# Patient Record
Sex: Female | Born: 1935 | Race: White | Hispanic: No | Marital: Married | Smoking: Never smoker
Health system: Southern US, Community
[De-identification: ages and names within clinical notes are randomized; demographics above are authoritative.]

## PROBLEM LIST (undated history)

## (undated) DIAGNOSIS — E785 Hyperlipidemia, unspecified: Secondary | ICD-10-CM

## (undated) DIAGNOSIS — I10 Essential (primary) hypertension: Secondary | ICD-10-CM

## (undated) DIAGNOSIS — I499 Cardiac arrhythmia, unspecified: Secondary | ICD-10-CM

## (undated) DIAGNOSIS — R002 Palpitations: Secondary | ICD-10-CM

## (undated) DIAGNOSIS — Z8719 Personal history of other diseases of the digestive system: Secondary | ICD-10-CM

## (undated) DIAGNOSIS — R112 Nausea with vomiting, unspecified: Secondary | ICD-10-CM

## (undated) DIAGNOSIS — F410 Panic disorder [episodic paroxysmal anxiety] without agoraphobia: Secondary | ICD-10-CM

## (undated) DIAGNOSIS — T8859XA Other complications of anesthesia, initial encounter: Secondary | ICD-10-CM

## (undated) DIAGNOSIS — Z9889 Other specified postprocedural states: Secondary | ICD-10-CM

## (undated) DIAGNOSIS — C9110 Chronic lymphocytic leukemia of B-cell type not having achieved remission: Secondary | ICD-10-CM

## (undated) DIAGNOSIS — R519 Headache, unspecified: Secondary | ICD-10-CM

## (undated) DIAGNOSIS — K219 Gastro-esophageal reflux disease without esophagitis: Secondary | ICD-10-CM

## (undated) DIAGNOSIS — I209 Angina pectoris, unspecified: Secondary | ICD-10-CM

## (undated) DIAGNOSIS — M1990 Unspecified osteoarthritis, unspecified site: Secondary | ICD-10-CM

## (undated) HISTORY — PX: TONSILLECTOMY: SUR1361

## (undated) HISTORY — PX: BREAST LUMPECTOMY: SHX2

## (undated) HISTORY — PX: EYE SURGERY: SHX253

## (undated) HISTORY — DX: Palpitations: R00.2

---

## 1992-07-09 HISTORY — PX: HEMORRHOID SURGERY: SHX153

## 2011-07-10 DIAGNOSIS — I639 Cerebral infarction, unspecified: Secondary | ICD-10-CM

## 2011-07-10 HISTORY — DX: Cerebral infarction, unspecified: I63.9

## 2011-10-12 ENCOUNTER — Emergency Department (HOSPITAL_COMMUNITY): Payer: Medicare Other

## 2011-10-12 ENCOUNTER — Encounter (HOSPITAL_COMMUNITY): Payer: Self-pay | Admitting: *Deleted

## 2011-10-12 ENCOUNTER — Other Ambulatory Visit: Payer: Self-pay

## 2011-10-12 ENCOUNTER — Inpatient Hospital Stay (HOSPITAL_COMMUNITY): Payer: Medicare Other

## 2011-10-12 ENCOUNTER — Inpatient Hospital Stay (HOSPITAL_COMMUNITY)
Admission: EM | Admit: 2011-10-12 | Discharge: 2011-10-15 | DRG: 065 | Disposition: A | Discharge status: Home / Self Care | Payer: Medicare Other | Attending: Family Medicine | Admitting: Family Medicine

## 2011-10-12 DIAGNOSIS — E785 Hyperlipidemia, unspecified: Secondary | ICD-10-CM

## 2011-10-12 DIAGNOSIS — I480 Paroxysmal atrial fibrillation: Secondary | ICD-10-CM

## 2011-10-12 DIAGNOSIS — I639 Cerebral infarction, unspecified: Secondary | ICD-10-CM

## 2011-10-12 DIAGNOSIS — I4891 Unspecified atrial fibrillation: Secondary | ICD-10-CM | POA: Diagnosis present

## 2011-10-12 DIAGNOSIS — E782 Mixed hyperlipidemia: Secondary | ICD-10-CM | POA: Insufficient documentation

## 2011-10-12 DIAGNOSIS — I10 Essential (primary) hypertension: Secondary | ICD-10-CM

## 2011-10-12 DIAGNOSIS — C9110 Chronic lymphocytic leukemia of B-cell type not having achieved remission: Secondary | ICD-10-CM

## 2011-10-12 DIAGNOSIS — Z66 Do not resuscitate: Secondary | ICD-10-CM | POA: Diagnosis present

## 2011-10-12 DIAGNOSIS — I6350 Cerebral infarction due to unspecified occlusion or stenosis of unspecified cerebral artery: Principal | ICD-10-CM | POA: Diagnosis present

## 2011-10-12 HISTORY — DX: Essential (primary) hypertension: I10

## 2011-10-12 HISTORY — DX: Panic disorder (episodic paroxysmal anxiety): F41.0

## 2011-10-12 HISTORY — DX: Chronic lymphocytic leukemia of B-cell type not having achieved remission: C91.10

## 2011-10-12 HISTORY — DX: Hyperlipidemia, unspecified: E78.5

## 2011-10-12 LAB — URINALYSIS, ROUTINE W REFLEX MICROSCOPIC
Bilirubin Urine: NEGATIVE
Glucose, UA: NEGATIVE mg/dL
Hgb urine dipstick: NEGATIVE
Specific Gravity, Urine: 1.026 (ref 1.005–1.030)
pH: 7.5 (ref 5.0–8.0)

## 2011-10-12 LAB — DIFFERENTIAL
Basophils Absolute: 0 10*3/uL (ref 0.0–0.1)
Lymphocytes Relative: 75 % — ABNORMAL HIGH (ref 12–46)
Monocytes Relative: 5 % (ref 3–12)
Neutro Abs: 3.6 10*3/uL (ref 1.7–7.7)

## 2011-10-12 LAB — POCT I-STAT, CHEM 8
Creatinine, Ser: 0.8 mg/dL (ref 0.50–1.10)
Glucose, Bld: 99 mg/dL (ref 70–99)
Hemoglobin: 15.6 g/dL — ABNORMAL HIGH (ref 12.0–15.0)
Sodium: 144 mEq/L (ref 135–145)
TCO2: 27 mmol/L (ref 0–100)

## 2011-10-12 LAB — CBC
HCT: 44.1 % (ref 36.0–46.0)
Platelets: 215 10*3/uL (ref 150–400)
RDW: 13.3 % (ref 11.5–15.5)
WBC: 18.7 10*3/uL — ABNORMAL HIGH (ref 4.0–10.5)

## 2011-10-12 LAB — COMPREHENSIVE METABOLIC PANEL
ALT: 22 U/L (ref 0–35)
AST: 27 U/L (ref 0–37)
Albumin: 3.9 g/dL (ref 3.5–5.2)
Alkaline Phosphatase: 77 U/L (ref 39–117)
Chloride: 105 mEq/L (ref 96–112)
Potassium: 4.5 mEq/L (ref 3.5–5.1)
Total Bilirubin: 0.5 mg/dL (ref 0.3–1.2)

## 2011-10-12 LAB — GLUCOSE, CAPILLARY: Glucose-Capillary: 108 mg/dL — ABNORMAL HIGH (ref 70–99)

## 2011-10-12 LAB — HEMOGLOBIN A1C: Hgb A1c MFr Bld: 5.4 % (ref <5.7)

## 2011-10-12 LAB — APTT: aPTT: 30 seconds (ref 24–37)

## 2011-10-12 MED ORDER — LORAZEPAM 2 MG/ML IJ SOLN
1.0000 mg | Freq: Once | INTRAMUSCULAR | Status: AC
  Administered 2011-10-12: 1 mg via INTRAVENOUS

## 2011-10-12 MED ORDER — HEPARIN SODIUM (PORCINE) 5000 UNIT/ML IJ SOLN
5000.0000 [IU] | Freq: Three times a day (TID) | INTRAMUSCULAR | Status: DC
  Administered 2011-10-12 – 2011-10-15 (×8): 5000 [IU] via SUBCUTANEOUS
  Filled 2011-10-12 (×12): qty 1

## 2011-10-12 MED ORDER — SODIUM CHLORIDE 0.9 % IV SOLN
INTRAVENOUS | Status: AC
  Administered 2011-10-12: 16:00:00 via INTRAVENOUS

## 2011-10-12 MED ORDER — VITAMIN D3 25 MCG (1000 UNIT) PO TABS
1000.0000 [IU] | ORAL_TABLET | Freq: Every day | ORAL | Status: DC
  Administered 2011-10-13 – 2011-10-15 (×3): 1000 [IU] via ORAL
  Filled 2011-10-12 (×4): qty 1

## 2011-10-12 MED ORDER — ATORVASTATIN CALCIUM 10 MG PO TABS
10.0000 mg | ORAL_TABLET | Freq: Every evening | ORAL | Status: DC
  Administered 2011-10-12: 10 mg via ORAL
  Filled 2011-10-12 (×2): qty 1

## 2011-10-12 MED ORDER — ADULT MULTIVITAMIN W/MINERALS CH
1.0000 | ORAL_TABLET | Freq: Every day | ORAL | Status: DC
  Filled 2011-10-12 (×4): qty 1

## 2011-10-12 MED ORDER — GADOBENATE DIMEGLUMINE 529 MG/ML IV SOLN
10.0000 mL | Freq: Once | INTRAVENOUS | Status: AC
  Administered 2011-10-12: 10 mL via INTRAVENOUS

## 2011-10-12 MED ORDER — METOPROLOL TARTRATE 1 MG/ML IV SOLN
2.5000 mg | Freq: Four times a day (QID) | INTRAVENOUS | Status: DC
  Administered 2011-10-12 – 2011-10-13 (×2): 2.5 mg via INTRAVENOUS
  Filled 2011-10-12 (×8): qty 5

## 2011-10-12 MED ORDER — OMEGA-3-ACID ETHYL ESTERS 1 G PO CAPS
1.0000 g | ORAL_CAPSULE | Freq: Two times a day (BID) | ORAL | Status: DC
  Filled 2011-10-12 (×7): qty 1

## 2011-10-12 MED ORDER — THERA M PLUS PO TABS: 1.0000 | ORAL_TABLET | Freq: Every day | ORAL | Status: DC

## 2011-10-12 MED ORDER — OMEGA-3 FATTY ACIDS 1000 MG PO CAPS: 1.0000 g | ORAL_CAPSULE | Freq: Two times a day (BID) | ORAL | Status: DC

## 2011-10-12 NOTE — H&P (Signed)
PCP:   Pamelia Hoit, MD, MD   Chief Complaint:  Stroke  Was totally normal until 14:00pm-was doing aperwoerk.  Went to the calender to see what was going on and was terying to relay this to her husband and then had some sluured speech.  THis has never happened before.  NO loc, no seizures , no abnormal movement sof the body, no CP, No headache  no pains-he didn't notice anything.  She did denote some thckness of tongue. she had a good's night rest overnight, and told by her family doctor that she needed to see him and was directed to the ED.  Review of Systems:  The patient denies  fever, weight loss + with the CLL, vision loss, decreased hearing, hoarseness, chest pain, syncope, dyspnea on exertion, peripheral edema, balance deficits, hemoptysis, abdominal pain, melena, hematochezia, severe indigestion/heartburn, hematuria, incontinence, genital sores, muscle weakness, suspicious skin lesions, transient blindness, difficulty walking, depression, unusual weight change, abnormal bleeding, enlarged lymph nodes, angioedema, and breast masses.  Past Medical History: Past Medical History  Diagnosis Date  . Hypertension   . Leukemia, chronic lymphoid     chronic lymphocytis Leukemia-MD Ottis Stain, Dalene Seltzer had this for about about 1 yr.   Is no t on any current therapy-is being watcvhed  . HLD (hyperlipidemia)     takes lipitor-last lipid panel about 3 mo ago, and was good  . Paroxysmal atrial fibrillation     takes  asa for this-"most of her life"  . Panic attacks     Past surgical history: Past Surgical History  Procedure Date  . Breast lumpectomy     Medications: Prior to Admission medications   Medication Sig Start Date End Date Taking? Authorizing Provider  aspirin EC 81 MG tablet Take 81 mg by mouth every evening.   Yes Historical Provider, MD  atorvastatin (LIPITOR) 10 MG tablet Take 10 mg by mouth every evening.   Yes Historical Provider, MD  cholecalciferol (VITAMIN D) 1000  UNITS tablet Take 1,000 Units by mouth daily.   Yes Historical Provider, MD  fish oil-omega-3 fatty acids 1000 MG capsule Take 1 g by mouth 2 (two) times daily.   Yes Historical Provider, MD  metoprolol succinate (TOPROL-XL) 50 MG 24 hr tablet Take 50 mg by mouth daily. Take with or immediately following a meal.   Yes Historical Provider, MD  Multiple Vitamins-Minerals (MULTIVITAMINS THER. W/MINERALS) TABS Take 1 tablet by mouth daily.   Yes Historical Provider, MD    Allergies:   Allergies  Allergen Reactions  . Phenobarbital Nausea And Vomiting  . Penicillins Rash  . Sulfa Antibiotics Rash    Social History:  does not have a smoking history on file. She does not have any smokeless tobacco history on file. Her alcohol and drug histories not on file.  Family History: Family History  Problem Relation Age of Onset  . Heart failure Mother     liverd till 110  . Stroke Father     LIVED TO 77 YR OF AGE  . Coronary artery disease Father     HEART ATTACK WHERN IN 80'S    Physical Exam: Filed Vitals:   10/12/11 1345 10/12/11 1347 10/12/11 1457 10/12/11 1458  BP: 136/60 136/60 153/77 153/77  Pulse: 81 76 69 72  Temp:  97.4 F (36.3 C)    TempSrc:  Oral    Resp: 10 16 17 20   SpO2: 96% 96% 99% 99%    HEENT-alert, oriented.  Some slurring of speech,  Although  slightly intelligible CHEST-cta b, no added sound CARDS-s1 s2 no m/r/g,  Rrr, Tele sinus ABD-soft, nt, nd SKIN-no rash noted NEURO-alert, oriented x3 speech: normal in context and clarity memory: intact grossly cranial nerves II-XII: intact motor strength: full proximally and distally no involuntary movements or tremors sensation: intact to vibration, pain, and light touch cerebellar: finger-to-nose and heel-to-shin intact gait: normal reflexes: full and symmetric plantar responses: downgoing bilaterally speech slightly garbled.  some twisting of mouth. Pupils normal.  fundus seems benign,-not really able to fully  assess.  vision by direct confrontation normal.    Labs on Admission:   [facility] 10/12/11 0938 10/12/11 0921  NA 144 142  K 4.0 4.5  CL 106 105  CO2 -- 27  GLUCOSE 99 99  BUN 16 15  CREATININE 0.80 0.73  CALCIUM -- 10.0  MG -- --  PHOS -- --    Basename 10/12/11 0921  AST 27  ALT 22  ALKPHOS 77  BILITOT 0.5  PROT 7.2  ALBUMIN 3.9   No results found for this basename: LIPASE:2,AMYLASE:2 in the last 72 hours  Basename 10/12/11 0938 10/12/11 0921  WBC -- 18.7*  NEUTROABS -- 3.6  HGB 15.6* 15.1*  HCT 46.0 44.1  MCV -- 90.7  PLT -- 215    Basename 10/12/11 0921  CKTOTAL 64  CKMB 2.1  CKMBINDEX --  TROPONINI <0.30   No results found for this basename: TSH,T4TOTAL,FREET3,T3FREE,THYROIDAB in the last 72 hours No results found for this basename: VITAMINB12:2,FOLATE:2,FERRITIN:2,TIBC:2,IRON:2,RETICCTPCT:2 in the last 72 hours  Radiological Exams on Admission: Dg Chest 2 View  10/12/2011  *RADIOLOGY REPORT*  Clinical Data: Cerebrovascular accident (right-sided facial drooping)  CHEST - 2 VIEW  Comparison: None.  Findings: Normal cardiac silhouette and mediastinal contours.  No focal airspace opacity.  No pleural effusion or pneumothorax.  No acute osseous abnormalities.  IMPRESSION: No acute cardiopulmonary disease.  Original Report Authenticated By: Waynard Reeds, M.D.   Ct Head Wo Contrast  10/12/2011  *RADIOLOGY REPORT*  Clinical Data: Right-sided facial droop since yesterday.  Weakness.  CT HEAD WITHOUT CONTRAST  Technique:  Contiguous axial images were obtained from the base of the skull through the vertex without contrast.  Comparison: None.  Findings: Opacification right sphenoid sinus air cell.  No intracranial hemorrhage.  Small vessel disease type changes.  No CT evidence of large acute infarct.  Atrophy without hydrocephalus.  No intracranial mass lesion detected on this unenhanced exam.  Vascular calcifications.  IMPRESSION: No intracranial hemorrhage or CT  evidence of large acute infarct.  Small vessel disease type changes.  Atrophy without hydrocephalus.  Vascular calcifications.  Right sphenoid sinus air cell opacification.  Original Report Authenticated By: Fuller Canada, M.D.   Mr Angiogram Almyra Deforest Wo Contrast  10/12/2011  *RADIOLOGY REPORT*  Clinical Data:  Stroke.  Right facial droop  MRI HEAD WITHOUT AND WITH CONTRAST MRA HEAD WITHOUT CONTRAST  Technique:  Multiplanar, multiecho pulse sequences of the brain and surrounding structures were obtained without and with intravenous contrast.  Angiographic images of the head were obtained using MRA technique without contrast.  Contrast: 10mL MULTIHANCE GADOBENATE DIMEGLUMINE 529 MG/ML IV SOLN  Comparison:  CT 10/12/2011  MRI HEAD WITHOUT AND WITH CONTRAST  Findings:  Restricted diffusion in the subcortical white matter in the left posterior frontal lobe.  No other acute infarct is identified.  Age appropriate atrophy.  Chronic microvascular ischemic changes in the white matter and pons.  Small areas of chronic ischemia in the basal  ganglia bilaterally.  Benign 12 mm cyst in the right temporal lobe.  Negative for hemorrhage or mass.  Postcontrast imaging reveals normal enhancement.  Negative for neoplasm. Sinusitis.  IMPRESSION: Acute infarct in the left posterior frontal subcortical white matter.  Chronic microvascular ischemic changes in the white matter and pons.  MRA HEAD  Findings: Both vertebral arteries are patent to the basilar. Bilateral PICA, superior cerebellar, and posterior cerebral arteries are patent without stenosis.  The basilar is widely patent.  Internal carotid artery is widely patent without stenosis. Anterior and  middle cerebral arteries are normal.  Negative for aneurysm.  IMPRESSION: Normal  Original Report Authenticated By: Camelia Phenes, M.D.   Mr Laqueta Jean Wo Contrast  10/12/2011  *RADIOLOGY REPORT*  Clinical Data:  Stroke.  Right facial droop  MRI HEAD WITHOUT AND WITH CONTRAST MRA HEAD  WITHOUT CONTRAST  Technique:  Multiplanar, multiecho pulse sequences of the brain and surrounding structures were obtained without and with intravenous contrast.  Angiographic images of the head were obtained using MRA technique without contrast.  Contrast: 10mL MULTIHANCE GADOBENATE DIMEGLUMINE 529 MG/ML IV SOLN  Comparison:  CT 10/12/2011  MRI HEAD WITHOUT AND WITH CONTRAST  Findings:  Restricted diffusion in the subcortical white matter in the left posterior frontal lobe.  No other acute infarct is identified.  Age appropriate atrophy.  Chronic microvascular ischemic changes in the white matter and pons.  Small areas of chronic ischemia in the basal ganglia bilaterally.  Benign 12 mm cyst in the right temporal lobe.  Negative for hemorrhage or mass.  Postcontrast imaging reveals normal enhancement.  Negative for neoplasm. Sinusitis.  IMPRESSION: Acute infarct in the left posterior frontal subcortical white matter.  Chronic microvascular ischemic changes in the white matter and pons.  MRA HEAD  Findings: Both vertebral arteries are patent to the basilar. Bilateral PICA, superior cerebellar, and posterior cerebral arteries are patent without stenosis.  The basilar is widely patent.  Internal carotid artery is widely patent without stenosis. Anterior and  middle cerebral arteries are normal.  Negative for aneurysm.  IMPRESSION: Normal  Original Report Authenticated By: Camelia Phenes, M.D.    Assessment/Plan Patient Active Problem List  Diagnoses  . CVA-L frontal area 4/6-Order MRI/A brain/neck.  Await work-up per Neuro Plavix needed.  Has P Afib-CHAD score 4= 8.5% if no coumadin.  Will need re-evaluation about decision from Neuro, and likely might need Coumadin  . Paroxysmal atrial fibrillation-Controlled on Metoprolol 50 mg XL.  Will need reassessment.  Will hold unless blood pressure above 200/100-allow permissive htn.  Keep Metoprolol iv for now with those parameters  . HLD (hyperlipidemia)-LDL, A1c to  be worked up  . 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  Could cause a thrombogenic state.  Will ask for some information from Dr. Beecher Mcardle, who has been contacted.  Hold on Hematology  . Hypertension-see above    >40 minutes, including direct f-f time, consult time and coordination time DNR Daughter and husband in room-explained course of care and likely dispo home 1-2 days   [facility] 10/12/2011, 3:13 PM

## 2011-10-12 NOTE — ED Notes (Signed)
To ed for eval of right side facial droop. Started yesterday afternoon. Grips equal and strong. No vision changes. Having difficulty talking and eating due to right side facial droop. Pt is ambulatory without difficulty

## 2011-10-12 NOTE — ED Notes (Signed)
Pt and husband given education of stroke symptoms and what to do in case of next time for either one of them. Both verbalized understanding as well as daughter.

## 2011-10-12 NOTE — Consult Note (Signed)
Referring Physician: Dr. Carleene Cooper    Chief Complaint: Acute stroke  HPI: Kelli Lucas is an 78 y.o. female with a history of hypertension, hyperlipidemia and heart palpitations, who experienced acute onset of weakness involving the right lower face and dysarthria at about 1:00 in the afternoon yesterday. Spell previous history of stroke. Patient has been on aspirin 81 mg per day. CT scan of her head showed no acute intracranial abnormality. MRI is pending. She has not experienced associated weakness involving right extremities and no sensory changes. NIH stroke score was 3.  LSN: 1 PM tPA Given: No: Beyond time window for treatment and mild deficits. MRankin: 0  Past Medical History: Hypertension, hyperlipidemia and heart palpitations.  Family History: Positive for stroke.  Medications: Prior to Admission:  Aspirin 81 mg per day Lipitor 10 mg per day Vitamin D 1000 units per day Omega-3 fish all 1000 mg per day Metoprolol 50 mg per day Multivitamin 1 per day  Physical Examination: Blood pressure 187/67, pulse 85, temperature 97.4 F (36.3 C), temperature source Oral, resp. rate 16, SpO2 98.00%.  Neurologic Examination: Mental Status: Alert, oriented, thought content appropriate.  Speech fluent without evidence of aphasia. Able to follow commands without difficulty. Cranial Nerves: II-Visual fields were normal. III/IV/VI-Pupils were equal and reacted. Extraocular movements were full and conjugate.    V/VII-no facial numbness; moderate right lower facial weakness VIII-normal. X-normal speech and symmetrical palatal movement. XII-midline tongue extension Motor: Slight right upper extremity pronator drift; otherwise normal motor findings Sensory: Normal throughout. Deep Tendon Reflexes: 2+ and symmetric. Plantars: Flexor bilaterally Cerebellar: Normal finger-to-nose testing. Carotid auscultation: Normal   Ct Head Wo Contrast  10/12/2011  *RADIOLOGY REPORT*  Clinical Data:  Right-sided facial droop since yesterday.  Weakness.  CT HEAD WITHOUT CONTRAST  Technique:  Contiguous axial images were obtained from the base of the skull through the vertex without contrast.  Comparison: None.  Findings: Opacification right sphenoid sinus air cell.  No intracranial hemorrhage.  Small vessel disease type changes.  No CT evidence of large acute infarct.  Atrophy without hydrocephalus.  No intracranial mass lesion detected on this unenhanced exam.  Vascular calcifications.  IMPRESSION: No intracranial hemorrhage or CT evidence of large acute infarct.  Small vessel disease type changes.  Atrophy without hydrocephalus.  Vascular calcifications.  Right sphenoid sinus air cell opacification.  Original Report Authenticated By: Fuller Canada, M.D.    Assessment: 78 y.o. female with probable acute left subcortical small vessel ischemic stroke.  Stroke Risk Factors - family history, hyperlipidemia and hypertension  Plan: 1. HgbA1c, fasting lipid panel 2. MRI, MRA  of the brain without contrast 3. PT consult, OT consult, Speech consult 4. Echocardiogram 5. Carotid dopplers 6. Prophylactic therapy-Antiplatelet med: Plavix - 75 mg per day. 7. Risk factor modification 8. Telemetry monitoring  C.R. Roseanne Reno, MD [facility] [phone]  10/12/2011, 10:47 AM

## 2011-10-12 NOTE — ED Provider Notes (Signed)
History     CSN: [id number]  Arrival date & time 10/12/11  0901   First MD Initiated Contact with Patient 10/12/11 [phone]      Chief Complaint  Patient presents with  . Cerebrovascular Accident    (Consider location/radiation/quality/duration/timing/severity/associated sxs/prior treatment) HPI Comments: Pt had onset of right facial droop yesterday.  She had trouble speaking.  She had no evaluation then.  Her symptoms did not include weakness in right side or difficulty of walking.  Her health problems include chronic lymphocytic leukemia, hyperlipidemia, palpitations.  Patient is a 78 y.o. female presenting with Acute Neurological Problem. The history is provided by the patient. No language interpreter was used.  Cerebrovascular Accident This is a new problem. The current episode started yesterday. The problem has not changed since onset.Associated symptoms comments: None . The symptoms are aggravated by nothing. The symptoms are relieved by nothing. She has tried nothing for the symptoms.    History reviewed. No pertinent past medical history.  History reviewed. No pertinent past surgical history.  History reviewed. No pertinent family history.  History  Substance Use Topics  . Smoking status: Not on file  . Smokeless tobacco: Not on file  . Alcohol Use: Not on file    OB History    Grav Para Term Preterm Abortions TAB SAB Ect Mult Living                  Review of Systems  Constitutional: Negative for fever and chills.  HENT: Positive for voice change.   Eyes: Negative.   Respiratory: Negative.   Cardiovascular: Negative.   Gastrointestinal: Negative.   Genitourinary: Negative.   Musculoskeletal: Negative.   Skin: Negative.   Neurological: Positive for facial asymmetry and speech difficulty.  Psychiatric/Behavioral: Negative.     Allergies  Phenobarbital; Penicillins; and Sulfa antibiotics  Home Medications   Current Outpatient Rx  Name Route Sig Dispense  Refill  . ASPIRIN EC 81 MG PO TBEC Oral Take 81 mg by mouth every evening.    . ATORVASTATIN CALCIUM 10 MG PO TABS Oral Take 10 mg by mouth every evening.    Marland Kitchen VITAMIN D 1000 UNITS PO TABS Oral Take 1,000 Units by mouth daily.    . OMEGA-3 FATTY ACIDS 1000 MG PO CAPS Oral Take 1 g by mouth 2 (two) times daily.    Marland Kitchen METOPROLOL SUCCINATE ER 50 MG PO TB24 Oral Take 50 mg by mouth daily. Take with or immediately following a meal.    . THERA M PLUS PO TABS Oral Take 1 tablet by mouth daily.      BP 187/67  Pulse 85  Temp(Src) 97.4 F (36.3 C) (Oral)  Resp 16  SpO2 98%  Physical Exam  Nursing note and vitals reviewed. Constitutional: She is oriented to person, place, and time. She appears well-developed and well-nourished.  HENT:  Head: Normocephalic and atraumatic.  Right Ear: External ear normal.  Left Ear: External ear normal.  Nose: Nose normal.  Mouth/Throat: Oropharynx is clear and moist.       Right facial weakness involving the lower 2/3s of her face.  Eyes: Conjunctivae and EOM are normal. Pupils are equal, round, and reactive to light.  Neck: Neck supple. No JVD present.       No carotid bruit.  Cardiovascular: Normal rate, regular rhythm and normal heart sounds.   Pulmonary/Chest: Effort normal and breath sounds normal.  Abdominal: Soft. Bowel sounds are normal.  Musculoskeletal: Normal range of motion. She exhibits no  edema and no tenderness.  Neurological: She is alert and oriented to person, place, and time.       She has right facial weakness and dysphasia.  There is no apparent weakness in the arms and legs.    Skin: Skin is warm and dry.  Psychiatric: She has a normal mood and affect. Her behavior is normal.    ED Course  Procedures (including critical care time)  Labs Reviewed  CBC - Abnormal; Notable for the following:    WBC 18.7 (*)    Hemoglobin 15.1 (*)    All other components within normal limits  POCT I-STAT, CHEM 8 - Abnormal; Notable for the  following:    Hemoglobin 15.6 (*)    All other components within normal limits  PROTIME-INR  APTT  DIFFERENTIAL  COMPREHENSIVE METABOLIC PANEL  CK TOTAL AND CKMB  TROPONIN I   Ct Head Wo Contrast  10/12/2011  *RADIOLOGY REPORT*  Clinical Data: Right-sided facial droop since yesterday.  Weakness.  CT HEAD WITHOUT CONTRAST  Technique:  Contiguous axial images were obtained from the base of the skull through the vertex without contrast.  Comparison: None.  Findings: Opacification right sphenoid sinus air cell.  No intracranial hemorrhage.  Small vessel disease type changes.  No CT evidence of large acute infarct.  Atrophy without hydrocephalus.  No intracranial mass lesion detected on this unenhanced exam.  Vascular calcifications.  IMPRESSION: No intracranial hemorrhage or CT evidence of large acute infarct.  Small vessel disease type changes.  Atrophy without hydrocephalus.  Vascular calcifications.  Right sphenoid sinus air cell opacification.  Original Report Authenticated By: Fuller Canada, M.D.   Natta.Cong AM  Date: 10/12/2011  Rate:79  Rhythm: normal sinus rhythm  QRS Axis: normal  Intervals: normal PQRS: Right atrial abnormality  ST/T Wave abnormalities: normal  Conduction Disutrbances:none  Narrative Interpretation: Normal EKG  Old EKG Reviewed: none available  10:14 AM Pt was seen and had physical examinaton.  CT was negative.  EKG was sinus rhythm.  Lab workup and MR/MRA of brain ordered.  Case discussed with Dr. Roseanne Reno, neurologist, who recommends admission by [facility], and he will consult.  12:43 PM Review of lab workup shows elevated WBC 18,700 with 75% lymphocytes, probably the result of the CLL  Cardiac markers are negative.  Waiting for the results of her MR/MRA of brain.    1. CVA (cerebral infarction)      2:43 PM MRI shows a stroke in the left frontal subcortical region. Call to Dr. Mahala Menghini --> admit to a neuro telemetry unit.      Carleene Cooper III,  MD 10/12/11 [phone]

## 2011-10-12 NOTE — ED Notes (Signed)
MD at bedside. 

## 2011-10-12 NOTE — Progress Notes (Signed)
Utilization Review Completed.Kelli Lucas T4/11/2011   

## 2011-10-12 NOTE — ED Notes (Signed)
Neurology at bedside.

## 2011-10-13 ENCOUNTER — Encounter (HOSPITAL_COMMUNITY): Payer: Self-pay

## 2011-10-13 ENCOUNTER — Inpatient Hospital Stay (HOSPITAL_COMMUNITY): Payer: Medicare Other

## 2011-10-13 LAB — LIPID PANEL
HDL: 47 mg/dL (ref >39)
Total CHOL/HDL Ratio: 3.3 RATIO

## 2011-10-13 LAB — URINE CULTURE
Colony Count: 30000
Culture  Setup Time: 201304051529

## 2011-10-13 MED ORDER — ASPIRIN 325 MG PO TABS
325.0000 mg | ORAL_TABLET | Freq: Every day | ORAL | Status: DC
  Administered 2011-10-13 – 2011-10-15 (×3): 325 mg via ORAL
  Filled 2011-10-13 (×3): qty 1

## 2011-10-13 MED ORDER — METOPROLOL SUCCINATE ER 50 MG PO TB24
50.0000 mg | ORAL_TABLET | Freq: Every day | ORAL | Status: DC
  Administered 2011-10-13 – 2011-10-15 (×3): 50 mg via ORAL
  Filled 2011-10-13 (×4): qty 1

## 2011-10-13 MED ORDER — GADOBENATE DIMEGLUMINE 529 MG/ML IV SOLN
15.0000 mL | Freq: Once | INTRAVENOUS | Status: AC | PRN
  Administered 2011-10-13: 15 mL via INTRAVENOUS

## 2011-10-13 MED ORDER — STROKE: EARLY STAGES OF RECOVERY BOOK
Freq: Once | Status: AC
  Administered 2011-10-13: 21:00:00
  Filled 2011-10-13: qty 1

## 2011-10-13 MED ORDER — ATORVASTATIN CALCIUM 40 MG PO TABS
40.0000 mg | ORAL_TABLET | Freq: Every evening | ORAL | Status: DC
  Administered 2011-10-13 – 2011-10-14 (×2): 40 mg via ORAL
  Filled 2011-10-13 (×3): qty 1

## 2011-10-13 NOTE — Progress Notes (Signed)
PROGRESS NOTE  Kelli Lucas ZOX:[id number] DOB: 09/06/1929 DOA: 10/12/2011 PCP: Pamelia Hoit, MD, MD  Brief narrative: 78 year old Caucasian female admitted 4/5 with symptoms of stroke  Past medical history: CLL, hyperlipidemia, hypertension, panic attacks, ? paroxysmal atrial fibrillation  Consultants:  Neurology  Procedures:  CT head 4/6 = no intracranial hemorrhage or CT evidence of large infarct, small vessel disease type changes  MRI brain 4/5 = acute infarct left posterior frontal subcortical white matter, MRA head showed patent internal carotid and anterior middle cerebral arteries  Antibiotics:  None   Subjective  Feels well, has a little bit of difficulty with speaking and swallowing but this seems to improve. Seems to be biting the side of her tongue when talking and eating. No fever no chills no other issues. Requests not to have diabetes checks Wants to be out of bed   Objective   Interim History: Note neurology's input  Objective: Filed Vitals:   10/13/11 0230 10/13/11 0525 10/13/11 0943 10/13/11 1322  BP: 149/63 142/80 142/60 114/74  Pulse: 74 76 80 84  Temp: 97.6 F (36.4 C) 98.1 F (36.7 C) 97.9 F (36.6 C) 97.5 F (36.4 C)  TempSrc: Oral Oral Oral Oral  Resp: 18 16 17 18   Height:  5' (1.524 m)    Weight:  56.7 kg (125 lb)    SpO2: 96% 97% 97% 98%    Intake/Output Summary (Last 24 hours) at 10/13/11 1334 Last data filed at 10/13/11 1300  Gross per 24 hour  Intake    220 ml  Output      0 ml  Net    220 ml    Exam:  HEENT-alert, oriented. Some slurring of speech, Although slightly intelligible  CHEST-cta b, no added sound  CARDS-s1 s2 no m/r/g, Rrr, Tele sinus  ABD-soft, nt, nd  SKIN-no rash noted  NEURO-alert, oriented x3  speech: normal in context and clarity-still a little slurred but improved memory: intact grossly  cranial nerves II-XII: intact  motor strength: full proximally and distally   Data Reviewed: Basic  Metabolic Panel:  Lab 10/12/11 8119 10/12/11 0921  NA 144 142  K 4.0 4.5  CL 106 105  CO2 -- 27  GLUCOSE 99 99  BUN 16 15  CREATININE 0.80 0.73  CALCIUM -- 10.0  MG -- --  PHOS -- --   Liver Function Tests:  Lab 10/12/11 0921  AST 27  ALT 22  ALKPHOS 77  BILITOT 0.5  PROT 7.2  ALBUMIN 3.9   No results found for this basename: LIPASE:5,AMYLASE:5 in the last 168 hours No results found for this basename: AMMONIA:5 in the last 168 hours CBC:  Lab 10/12/11 0938 10/12/11 0921  WBC -- 18.7*  NEUTROABS -- 3.6  HGB 15.6* 15.1*  HCT 46.0 44.1  MCV -- 90.7  PLT -- 215   Cardiac Enzymes:  Lab 10/12/11 0921  CKTOTAL 64  CKMB 2.1  CKMBINDEX --  TROPONINI <0.30   BNP: No components found with this basename: POCBNP:5 CBG:  Lab 10/13/11 0708 10/12/11 2141 10/12/11 0950  GLUCAP 80 108* 100*    No results found for this or any previous visit (from the past 240 hour(s)).   Studies:              All Imaging reviewed and is as per above notation   Scheduled Meds:   . sodium chloride   Intravenous STAT  . aspirin  325 mg Oral Daily  . atorvastatin  10 mg Oral QPM  .  cholecalciferol  1,000 Units Oral Daily  . heparin  5,000 Units Subcutaneous Q8H  . metoprolol succinate  50 mg Oral Daily  . mulitivitamin with minerals  1 tablet Oral Daily  . omega-3 acid ethyl esters  1 g Oral BID  . DISCONTD: fish oil-omega-3 fatty acids  1 g Oral BID  . DISCONTD: metoprolol  2.5 mg Intravenous Q6H  . DISCONTD: multivitamins ther. w/minerals  1 tablet Oral Daily   Continuous Infusions:    Assessment/Plan: Assessment/Plan  Patient Active Problem List   Diagnoses   .  CVA-L frontal area 4/6-Order MRI/A brain/neck. Await work-up per Neuro.  Plavix now changed to aspirin 325 mg daily. ?P Afib-CHAD score 4= 8.5% if no coumadin-given history of his tachycardia and questionable history agreed to keep on telemetry and will arrange for outpatient event monitor.  Will see if physical  therapy can help facilitate improvement. She still has some numbness in her left side of her mouth and they may be able to give modalities for this.  Be out of bed to chair   .  ?Paroxysmal atrial fibrillation-Controlled on Metoprolol 50 mg XL. Will need reassessment. Will hold unless blood pressure above 200/100-allow permissive htn. Keep Metoprolol iv for now with those parameters   .  HLD (hyperlipidemia)-LDL controlled at 86, HDL is 47-increase Lipitor to 40 mg every afternoon , A1c was 5.7 we'll discontinue blood sugar checks   .  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 Could cause a thrombogenic state. Hold on Hematology consult   .  Hypertension-uncontrolled we will reimplement metoprolol as per above      Code Status: full  Family Communication: daughter in room who understands work-up still needs to be completed Disposition Plan: likely home in a.m.   Pleas Koch, MD  [facility] Pager [phone] 10/13/2011, 1:34 PM    LOS: 1 day

## 2011-10-13 NOTE — Progress Notes (Signed)
Echo technician came up to preform 2D echo on patient but patient was down in Radiology at the time. Technician said she would try to come back and test again later. As of 1830 no 2D echo, so patient informed it will most likely happen tomorrow.  Kelli Lucas, Yvette Rack

## 2011-10-13 NOTE — Progress Notes (Signed)
Stroke Team Progress Note  HISTORY Was totally normal until 14:00pm-was doing aperwoerk. Went to the calender to see what was going on and was terying to relay this to her husband and then had some sluured speech. THis has never happened before. NO loc, no seizures , no abnormal movement sof the body, no CP, No headache no pains-he didn't notice anything. She did denote some thckness of tongue. she had a good's night rest overnight, and told by her family doctor that she needed to see him and was directed to the ED.      SUBJECTIVE Her daughter is at the bedside. Overall she feels her condition is gradually improving. The patient mainly notes some slurred speech and right facial droop. The patient was on aspirin prior to admission. The patient denies problems with swallowing. The patient denies headache. The patient indicates that she has been able to an delay without problems.  OBJECTIVE Most recent Vital Signs: Temp: 98.1 F (36.7 C) (04/06 0525) Temp src: Oral (04/06 0525) BP: 142/80 mmHg (04/06 0525) Pulse Rate: 76  (04/06 0525) Respiratory Rate: 16 O2 Saturation: 97%  CBG (last 3)  Basename 10/13/11 0708 10/12/11 2141 10/12/11 0950  GLUCAP 80 108* 100*   Intake/Output from previous day: 04/05 0701 - 04/06 0700 In: 100 [P.O.:100] Out: -   IV Fluid Intake:    Medications    . sodium chloride   Intravenous STAT  . atorvastatin  10 mg Oral QPM  . cholecalciferol  1,000 Units Oral Daily  . gadobenate dimeglumine  10 mL Intravenous Once  . heparin  5,000 Units Subcutaneous Q8H  . LORazepam  1 mg Intravenous Once  . metoprolol  2.5 mg Intravenous Q6H  . mulitivitamin with minerals  1 tablet Oral Daily  . omega-3 acid ethyl esters  1 g Oral BID  . DISCONTD: fish oil-omega-3 fatty acids  1 g Oral BID  . DISCONTD: multivitamins ther. w/minerals  1 tablet Oral Daily  PRN   Diet:  Cardiac thin liquids Activity: Up with assistance DVT Prophylaxis:  Subcutaneous  heparin  Significant Diagnostic Studies: CBC    Component Value Date/Time   WBC 18.7* 10/12/2011 0921   RBC 4.86 10/12/2011 0921   HGB 15.6* 10/12/2011 0938   HCT 46.0 10/12/2011 0938   PLT 215 10/12/2011 0921   MCV 90.7 10/12/2011 0921   MCH 31.1 10/12/2011 0921   MCHC 34.2 10/12/2011 0921   RDW 13.3 10/12/2011 0921   LYMPHSABS 14.0* 10/12/2011 0921   MONOABS 0.9 10/12/2011 0921   EOSABS 0.2 10/12/2011 0921   BASOSABS 0.0 10/12/2011 0921   CMP    Component Value Date/Time   NA 144 10/12/2011 0938   K 4.0 10/12/2011 0938   CL 106 10/12/2011 0938   CO2 27 10/12/2011 0921   GLUCOSE 99 10/12/2011 0938   BUN 16 10/12/2011 0938   CREATININE 0.80 10/12/2011 0938   CALCIUM 10.0 10/12/2011 0921   PROT 7.2 10/12/2011 0921   ALBUMIN 3.9 10/12/2011 0921   AST 27 10/12/2011 0921   ALT 22 10/12/2011 0921   ALKPHOS 77 10/12/2011 0921   BILITOT 0.5 10/12/2011 0921   GFRNONAA 79* 10/12/2011 0921   GFRAA >90 10/12/2011 0921   COAGS Lab Results  Component Value Date   INR 0.93 10/12/2011   Lipid Panel    Component Value Date/Time   CHOL 154 10/13/2011 0500   TRIG 105 10/13/2011 0500   HDL 47 10/13/2011 0500   CHOLHDL 3.3 10/13/2011 0500  VLDL 21 10/13/2011 0500   LDLCALC 86 10/13/2011 0500   HgbA1C  Lab Results  Component Value Date   HGBA1C 5.4 10/12/2011   Urine Drug Screen  No results found for this basename: labopia, cocainscrnur, labbenz, amphetmu, thcu, labbarb    Alcohol Level No results found for this basename: eth     Results for orders placed during the hospital encounter of 10/12/11 (from the past 24 hour(s))  POCT I-STAT, CHEM 8     Status: Abnormal   Collection Time   10/12/11  9:38 AM      Component Value Range   Sodium 144  135 - 145 (mEq/L)   Potassium 4.0  3.5 - 5.1 (mEq/L)   Chloride 106  96 - 112 (mEq/L)   BUN 16  6 - 23 (mg/dL)   Creatinine, Ser 2.54  0.50 - 1.10 (mg/dL)   Glucose, Bld 99  70 - 99 (mg/dL)   Calcium, Ion 2.70  6.23 - 1.32 (mmol/L)   TCO2 27  0 - 100 (mmol/L)   Hemoglobin 15.6 (*) 12.0 - 15.0  (g/dL)   HCT 76.2  83.1 - 51.7 (%)  GLUCOSE, CAPILLARY     Status: Abnormal   Collection Time   10/12/11  9:50 AM      Component Value Range   Glucose-Capillary 100 (*) 70 - 99 (mg/dL)   Comment 1 Notify RN     Comment 2 Documented in Chart    URINALYSIS, ROUTINE W REFLEX MICROSCOPIC     Status: Abnormal   Collection Time   10/12/11  2:09 PM      Component Value Range   Color, Urine YELLOW  YELLOW    APPearance HAZY (*) CLEAR    Specific Gravity, Urine 1.026  1.005 - 1.030    pH 7.5  5.0 - 8.0    Glucose, UA NEGATIVE  NEGATIVE (mg/dL)   Hgb urine dipstick NEGATIVE  NEGATIVE    Bilirubin Urine NEGATIVE  NEGATIVE    Ketones, ur NEGATIVE  NEGATIVE (mg/dL)   Protein, ur NEGATIVE  NEGATIVE (mg/dL)   Urobilinogen, UA 0.2  0.0 - 1.0 (mg/dL)   Nitrite NEGATIVE  NEGATIVE    Leukocytes, UA NEGATIVE  NEGATIVE   HEMOGLOBIN A1C     Status: Normal   Collection Time   10/12/11  5:00 PM      Component Value Range   Hemoglobin A1C 5.4  <5.7 (%)   Mean Plasma Glucose 108  <117 (mg/dL)  GLUCOSE, CAPILLARY     Status: Abnormal   Collection Time   10/12/11  9:41 PM      Component Value Range   Glucose-Capillary 108 (*) 70 - 99 (mg/dL)   Comment 1 Notify RN     Comment 2 Documented in Chart    LIPID PANEL     Status: Normal   Collection Time   10/13/11  5:00 AM      Component Value Range   Cholesterol 154  0 - 200 (mg/dL)   Triglycerides 616  <073 (mg/dL)   HDL 47  >71 (mg/dL)   Total CHOL/HDL Ratio 3.3     VLDL 21  0 - 40 (mg/dL)   LDL Cholesterol 86  0 - 99 (mg/dL)  GLUCOSE, CAPILLARY     Status: Normal   Collection Time   10/13/11  7:08 AM      Component Value Range   Glucose-Capillary 80  70 - 99 (mg/dL)    Dg Chest 2 View  0/12/2692  *  RADIOLOGY REPORT*  Clinical Data: Cerebrovascular accident (right-sided facial drooping)  CHEST - 2 VIEW  Comparison: None.  Findings: Normal cardiac silhouette and mediastinal contours.  No focal airspace opacity.  No pleural effusion or pneumothorax.  No  acute osseous abnormalities.  IMPRESSION: No acute cardiopulmonary disease.  Original Report Authenticated By: Waynard Reeds, M.D.   Ct Head Wo Contrast  10/12/2011  *RADIOLOGY REPORT*  Clinical Data: Right-sided facial droop since yesterday.  Weakness.  CT HEAD WITHOUT CONTRAST  Technique:  Contiguous axial images were obtained from the base of the skull through the vertex without contrast.  Comparison: None.  Findings: Opacification right sphenoid sinus air cell.  No intracranial hemorrhage.  Small vessel disease type changes.  No CT evidence of large acute infarct.  Atrophy without hydrocephalus.  No intracranial mass lesion detected on this unenhanced exam.  Vascular calcifications.  IMPRESSION: No intracranial hemorrhage or CT evidence of large acute infarct.  Small vessel disease type changes.  Atrophy without hydrocephalus.  Vascular calcifications.  Right sphenoid sinus air cell opacification.  Original Report Authenticated By: Fuller Canada, M.D.   Mr Angiogram Almyra Deforest Wo Contrast  10/12/2011  *RADIOLOGY REPORT*  Clinical Data:  Stroke.  Right facial droop  MRI HEAD WITHOUT AND WITH CONTRAST MRA HEAD WITHOUT CONTRAST  Technique:  Multiplanar, multiecho pulse sequences of the brain and surrounding structures were obtained without and with intravenous contrast.  Angiographic images of the head were obtained using MRA technique without contrast.  Contrast: 10mL MULTIHANCE GADOBENATE DIMEGLUMINE 529 MG/ML IV SOLN  Comparison:  CT 10/12/2011  MRI HEAD WITHOUT AND WITH CONTRAST  Findings:  Restricted diffusion in the subcortical white matter in the left posterior frontal lobe.  No other acute infarct is identified.  Age appropriate atrophy.  Chronic microvascular ischemic changes in the white matter and pons.  Small areas of chronic ischemia in the basal ganglia bilaterally.  Benign 12 mm cyst in the right temporal lobe.  Negative for hemorrhage or mass.  Postcontrast imaging reveals normal enhancement.   Negative for neoplasm. Sinusitis.  IMPRESSION: Acute infarct in the left posterior frontal subcortical white matter.  Chronic microvascular ischemic changes in the white matter and pons.  MRA HEAD  Findings: Both vertebral arteries are patent to the basilar. Bilateral PICA, superior cerebellar, and posterior cerebral arteries are patent without stenosis.  The basilar is widely patent.  Internal carotid artery is widely patent without stenosis. Anterior and  middle cerebral arteries are normal.  Negative for aneurysm.  IMPRESSION: Normal  Original Report Authenticated By: Camelia Phenes, M.D.   Mr Laqueta Jean Wo Contrast  10/12/2011  *RADIOLOGY REPORT*  Clinical Data:  Stroke.  Right facial droop  MRI HEAD WITHOUT AND WITH CONTRAST MRA HEAD WITHOUT CONTRAST  Technique:  Multiplanar, multiecho pulse sequences of the brain and surrounding structures were obtained without and with intravenous contrast.  Angiographic images of the head were obtained using MRA technique without contrast.  Contrast: 10mL MULTIHANCE GADOBENATE DIMEGLUMINE 529 MG/ML IV SOLN  Comparison:  CT 10/12/2011  MRI HEAD WITHOUT AND WITH CONTRAST  Findings:  Restricted diffusion in the subcortical white matter in the left posterior frontal lobe.  No other acute infarct is identified.  Age appropriate atrophy.  Chronic microvascular ischemic changes in the white matter and pons.  Small areas of chronic ischemia in the basal ganglia bilaterally.  Benign 12 mm cyst in the right temporal lobe.  Negative for hemorrhage or mass.  Postcontrast imaging reveals normal  enhancement.  Negative for neoplasm. Sinusitis.  IMPRESSION: Acute infarct in the left posterior frontal subcortical white matter.  Chronic microvascular ischemic changes in the white matter and pons.  MRA HEAD  Findings: Both vertebral arteries are patent to the basilar. Bilateral PICA, superior cerebellar, and posterior cerebral arteries are patent without stenosis.  The basilar is widely patent.   Internal carotid artery is widely patent without stenosis. Anterior and  middle cerebral arteries are normal.  Negative for aneurysm.  IMPRESSION: Normal  Original Report Authenticated By: Camelia Phenes, M.D.    CT of the brain    IMPRESSION:  No intracranial hemorrhage or CT evidence of large acute infarct.  CT angio  not ordered  MRI of the brain   IMPRESSION:  Acute infarct in the left posterior frontal subcortical white  matter.   MRA of the brain   IMPRESSION:  Normal   2D Echocardiogram  pending  Carotid Doppler  pending  CXR   IMPRESSION:  No acute cardiopulmonary disease.    EKG   SINUS RHYTHM ~ normal P axis, V-rate 50- 99 CONSIDER RIGHT ATRIAL ABNORMALITY ~ P >0.20mV limb lead No previous ECGs available  Physical Exam   The patient is alert and cooperative.  Neurologic exam reveals full extraocular movements, speech is normal. Visual fields are full.  Motor testing reveals good strength of all four extremities.  The patient has good finger-nose-finger and heel-to-shin bilaterally. Gait was not tested.  Deep tendon reflexes are symmetric and normal. Toes are down going bilaterally.  Speech is slightly dysarthric, not aphasic. Facial asymmetry is noted, decreased right nasolabial fold.      ASSESSMENT Kelli Lucas is a 78 y.o. female with a left posterior frontal stroke, secondary to possible embolism. On aspirin 81 mg orally every day for secondary stroke prevention prior to admission.  Stroke risk factors:  atrial fibrillation, hyperlipidemia and hypertension  Atrial fibrillation is listed as a problem in the medical history, but the patient indicates that she has never had this documented. The patient does report an almost lifelong history of palpitations. The stroke seen by MRI could be cardioembolic. The 2-D echocardiogram and a carotid Doppler study are pending.  In the future, a TEE and a prolonged cardiac monitoring study may be  indicated.  Hospital day # 1  TREATMENT/PLAN Continue aspirin 325 mg orally every day for secondary stroke prevention.   2-D echocardiogram, carotid Doppler study pending.  The patient is to get up with assistance. The patient likely does not need much in the way of physical and occupational therapy.   Lesly Dukes

## 2011-10-14 DIAGNOSIS — I517 Cardiomegaly: Secondary | ICD-10-CM

## 2011-10-14 LAB — TSH: TSH: 1.619 u[IU]/mL (ref 0.350–4.500)

## 2011-10-14 LAB — RAPID URINE DRUG SCREEN, HOSP PERFORMED
Barbiturates: NOT DETECTED
Cocaine: NOT DETECTED

## 2011-10-14 NOTE — Progress Notes (Signed)
PROGRESS NOTE  Kelli Lucas AVW:[id number] DOB: [birth year] DOA: 10/12/2011 PCP: Pamelia Hoit, MD, MD  Brief narrative: 78 year old Caucasian female admitted 4/5 with symptoms of stroke  Past medical history: CLL, hyperlipidemia, hypertension, panic attacks, ? paroxysmal atrial fibrillation  Consultants:  Neurology  Procedures:  CT head 4/6 = no intracranial hemorrhage or CT evidence of large infarct, small vessel disease type changes  MRI brain 4/5 = acute infarct left posterior frontal subcortical white matter, MRA head showed patent internal carotid and anterior middle cerebral arteries  Antibiotics:  None   Subjective  Feels well, has a little bit of difficulty with speaking and swallowing but this seems to improve. Seems to be biting the side of her tongue when talking and eating. No fever no chills no other issues. Requests not to have diabetes checks Wants to be out of bed   Objective   Interim History: Note neurology's input  Objective: Filed Vitals:   10/13/11 2200 10/14/11 0236 10/14/11 0639 10/14/11 1000  BP: 167/77 138/67 145/76 90/72  Pulse: 77 73 70 58  Temp: 97.8 F (36.6 C) 97.8 F (36.6 C) 97.6 F (36.4 C) 97.7 F (36.5 C)  TempSrc: Oral Oral Oral Oral  Resp: 20 18 20 18   Height:      Weight:      SpO2: 96% 95% 94% 100%    Intake/Output Summary (Last 24 hours) at 10/14/11 1137 Last data filed at 10/13/11 2330  Gross per 24 hour  Intake    320 ml  Output      0 ml  Net    320 ml    Exam:  HEENT-alert, oriented. Some slurring of speech, Although slightly intelligible  CHEST-cta b, no added sound  CARDS-s1 s2 no m/r/g, Rrr, Tele sinus  ABD-soft, nt, nd  SKIN-no rash noted  NEURO-alert, oriented x3  speech: normal in context and clarity-still a little slurred but improved memory: intact grossly  cranial nerves II-XII: intact  motor strength: full proximally and distally   Data Reviewed: Basic Metabolic Panel:  Lab 10/12/11  0938 10/12/11 0921  NA 144 142  K 4.0 4.5  CL 106 105  CO2 -- 27  GLUCOSE 99 99  BUN 16 15  CREATININE 0.80 0.73  CALCIUM -- 10.0  MG -- --  PHOS -- --   Liver Function Tests:  Lab 10/12/11 0921  AST 27  ALT 22  ALKPHOS 77  BILITOT 0.5  PROT 7.2  ALBUMIN 3.9   No results found for this basename: LIPASE:5,AMYLASE:5 in the last 168 hours No results found for this basename: AMMONIA:5 in the last 168 hours CBC:  Lab 10/12/11 0938 10/12/11 0921  WBC -- 18.7*  NEUTROABS -- 3.6  HGB 15.6* 15.1*  HCT 46.0 44.1  MCV -- 90.7  PLT -- 215   Cardiac Enzymes:  Lab 10/12/11 0921  CKTOTAL 64  CKMB 2.1  CKMBINDEX --  TROPONINI <0.30   BNP: No components found with this basename: POCBNP:5 CBG:  Lab 10/13/11 0708 10/12/11 2141 10/12/11 0950  GLUCAP 80 108* 100*    Recent Results (from the past 240 hour(s))  URINE CULTURE     Status: Normal   Collection Time   10/12/11  2:09 PM      Component Value Range Status Comment   Specimen Description URINE, CLEAN CATCH   Final    Special Requests NONE   Final    Culture  Setup Time [id number]   Final    Colony Count 30,000 COLONIES/ML  Final    Culture     Final    Value: Multiple bacterial morphotypes present, none predominant. Suggest appropriate recollection if clinically indicated.   Report Status 10/13/2011 FINAL   Final      Studies:              All Imaging reviewed and is as per above notation   Scheduled Meds:    .  stroke: mapping our early stages of recovery book   Does not apply Once  . aspirin  325 mg Oral Daily  . atorvastatin  40 mg Oral QPM  . cholecalciferol  1,000 Units Oral Daily  . heparin  5,000 Units Subcutaneous Q8H  . metoprolol succinate  50 mg Oral Daily  . mulitivitamin with minerals  1 tablet Oral Daily  . omega-3 acid ethyl esters  1 g Oral BID  . DISCONTD: atorvastatin  10 mg Oral QPM  . DISCONTD: metoprolol  2.5 mg Intravenous Q6H   Continuous Infusions:     Assessment/Plan: Assessment/Plan  Patient Active Problem List   Diagnoses   .  CVA-L frontal area 4/6-Order MRI/A brain/neck. Await work-up per Neuro.  Plavix now changed to aspirin 325 mg daily. ?P Afib-CHAD score 4= 8.5% if no coumadin-given history of this tachycardia and questionable history of AFIB agreed to keep on telemetry and will arrange for outpatient event monitor.  For TEE, given Neurologist verbalized this appeared to be a small embolic CVA with no source.  If cannot obtain within next 24 hours, [facility] has cleared her to go home with procedure as out-patient Will get TSH in am as awakens some night with sweats and anxiety. Will see if physical therapy can help facilitate improvement. She still has some numbness in her left side of her mouth and they may be able to give modalities for this.  Be out of bed to chair   .  ?Paroxysmal atrial fibrillation-Controlled on Metoprolol 50 mg XL.   Marland Kitchen  HLD (hyperlipidemia)-LDL controlled at 86, HDL is 47-increase Lipitor to 40 mg every afternoon , A1c was 5.7 we'll discontinue blood sugar checks   .  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 Could cause a thrombogenic state. Hold on Hematology consult.  RPt CBC am,-had leucocytosis on admit that could be related to this   .  Hypertension-uncontrolled we will reimplement metoprolol as per above-slightly lower pressures.  Might need lower dose metoprolol.  Re-evaluate in am   Code Status: full  Family Communication: daughter in room who understands work-up still needs to be completed Disposition Plan: likely home in a.m.   Pleas Koch, MD  [facility] Pager [phone] 10/14/2011, 11:37 AM    LOS: 2 days

## 2011-10-14 NOTE — Progress Notes (Signed)
  Echocardiogram 2D Echocardiogram has been performed.  Athalia Setterlund, Real Cons 10/14/2011, 8:58 AM

## 2011-10-14 NOTE — Progress Notes (Signed)
Stroke Team Progress Note  HISTORY Was totally normal until 14:00pm-was doing aperwoerk. Went to the calender to see what was going on and was terying to relay this to her husband and then had some sluured speech. THis has never happened before. NO loc, no seizures , no abnormal movement sof the body, no CP, No headache no pains-he didn't notice anything. She did denote some thckness of tongue. she had a good's night rest overnight, and told by her family doctor that she needed to see him and was directed to the ED.      SUBJECTIVE Her daughter is at the bedside. Overall she feels her condition is gradually improving. The patient mainly notes some slurred speech and right facial droop. The patient was on aspirin prior to admission. The patient denies problems with swallowing. The patient denies headache. The patient indicates that she has been able to ambulate without problems.  OBJECTIVE Most recent Vital Signs: Temp: 97.6 F (36.4 C) (04/07 0639) Temp src: Oral (04/07 0639) BP: 145/76 mmHg (04/07 0639) Pulse Rate: 70  (04/07 0639) Respiratory Rate: 20 O2 Saturation: 94%  CBG (last 3)   Basename 10/13/11 0708 10/12/11 2141 10/12/11 0950  GLUCAP 80 108* 100*   Intake/Output from previous day: 04/06 0701 - 04/07 0700 In: 320 [P.O.:320] Out: -   IV Fluid Intake:    Medications     .  stroke: mapping our early stages of recovery book   Does not apply Once  . aspirin  325 mg Oral Daily  . atorvastatin  40 mg Oral QPM  . cholecalciferol  1,000 Units Oral Daily  . heparin  5,000 Units Subcutaneous Q8H  . metoprolol succinate  50 mg Oral Daily  . mulitivitamin with minerals  1 tablet Oral Daily  . omega-3 acid ethyl esters  1 g Oral BID  . DISCONTD: atorvastatin  10 mg Oral QPM  . DISCONTD: metoprolol  2.5 mg Intravenous Q6H  PRN gadobenate dimeglumine  Diet:  Cardiac thin liquids Activity: Up with assistance DVT Prophylaxis:  Subcutaneous heparin  Significant Diagnostic  Studies: CBC    Component Value Date/Time   WBC 18.7* 10/12/2011 0921   RBC 4.86 10/12/2011 0921   HGB 15.6* 10/12/2011 0938   HCT 46.0 10/12/2011 0938   PLT 215 10/12/2011 0921   MCV 90.7 10/12/2011 0921   MCH 31.1 10/12/2011 0921   MCHC 34.2 10/12/2011 0921   RDW 13.3 10/12/2011 0921   LYMPHSABS 14.0* 10/12/2011 0921   MONOABS 0.9 10/12/2011 0921   EOSABS 0.2 10/12/2011 0921   BASOSABS 0.0 10/12/2011 0921   CMP    Component Value Date/Time   NA 144 10/12/2011 0938   K 4.0 10/12/2011 0938   CL 106 10/12/2011 0938   CO2 27 10/12/2011 0921   GLUCOSE 99 10/12/2011 0938   BUN 16 10/12/2011 0938   CREATININE 0.80 10/12/2011 0938   CALCIUM 10.0 10/12/2011 0921   PROT 7.2 10/12/2011 0921   ALBUMIN 3.9 10/12/2011 0921   AST 27 10/12/2011 0921   ALT 22 10/12/2011 0921   ALKPHOS 77 10/12/2011 0921   BILITOT 0.5 10/12/2011 0921   GFRNONAA 79* 10/12/2011 0921   GFRAA >90 10/12/2011 0921   COAGS Lab Results  Component Value Date   INR 0.93 10/12/2011   Lipid Panel    Component Value Date/Time   CHOL 154 10/13/2011 0500   TRIG 105 10/13/2011 0500   HDL 47 10/13/2011 0500   CHOLHDL 3.3 10/13/2011 0500   VLDL 21  10/13/2011 0500   LDLCALC 86 10/13/2011 0500   HgbA1C  Lab Results  Component Value Date   HGBA1C 5.4 10/12/2011   Urine Drug Screen  No results found for this basename: labopia,  cocainscrnur,  labbenz,  amphetmu,  thcu,  labbarb    Alcohol Level No results found for this basename: eth     No results found for this or any previous visit (from the past 24 hour(s)).  Dg Chest 2 View  10/12/2011  *RADIOLOGY REPORT*  Clinical Data: Cerebrovascular accident (right-sided facial drooping)  CHEST - 2 VIEW  Comparison: None.  Findings: Normal cardiac silhouette and mediastinal contours.  No focal airspace opacity.  No pleural effusion or pneumothorax.  No acute osseous abnormalities.  IMPRESSION: No acute cardiopulmonary disease.  Original Report Authenticated By: Waynard Reeds, M.D.   Ct Head Wo Contrast  10/12/2011  *RADIOLOGY  REPORT*  Clinical Data: Right-sided facial droop since yesterday.  Weakness.  CT HEAD WITHOUT CONTRAST  Technique:  Contiguous axial images were obtained from the base of the skull through the vertex without contrast.  Comparison: None.  Findings: Opacification right sphenoid sinus air cell.  No intracranial hemorrhage.  Small vessel disease type changes.  No CT evidence of large acute infarct.  Atrophy without hydrocephalus.  No intracranial mass lesion detected on this unenhanced exam.  Vascular calcifications.  IMPRESSION: No intracranial hemorrhage or CT evidence of large acute infarct.  Small vessel disease type changes.  Atrophy without hydrocephalus.  Vascular calcifications.  Right sphenoid sinus air cell opacification.  Original Report Authenticated By: Fuller Canada, M.D.   Mr Angiogram Almyra Deforest Wo Contrast  10/12/2011  *RADIOLOGY REPORT*  Clinical Data:  Stroke.  Right facial droop  MRI HEAD WITHOUT AND WITH CONTRAST MRA HEAD WITHOUT CONTRAST  Technique:  Multiplanar, multiecho pulse sequences of the brain and surrounding structures were obtained without and with intravenous contrast.  Angiographic images of the head were obtained using MRA technique without contrast.  Contrast: 10mL MULTIHANCE GADOBENATE DIMEGLUMINE 529 MG/ML IV SOLN  Comparison:  CT 10/12/2011  MRI HEAD WITHOUT AND WITH CONTRAST  Findings:  Restricted diffusion in the subcortical white matter in the left posterior frontal lobe.  No other acute infarct is identified.  Age appropriate atrophy.  Chronic microvascular ischemic changes in the white matter and pons.  Small areas of chronic ischemia in the basal ganglia bilaterally.  Benign 12 mm cyst in the right temporal lobe.  Negative for hemorrhage or mass.  Postcontrast imaging reveals normal enhancement.  Negative for neoplasm. Sinusitis.  IMPRESSION: Acute infarct in the left posterior frontal subcortical white matter.  Chronic microvascular ischemic changes in the white matter and  pons.  MRA HEAD  Findings: Both vertebral arteries are patent to the basilar. Bilateral PICA, superior cerebellar, and posterior cerebral arteries are patent without stenosis.  The basilar is widely patent.  Internal carotid artery is widely patent without stenosis. Anterior and  middle cerebral arteries are normal.  Negative for aneurysm.  IMPRESSION: Normal  Original Report Authenticated By: Camelia Phenes, M.D.   Mr Angiogram Neck W Wo Contrast  10/13/2011  *RADIOLOGY REPORT*  Clinical Data:  Stroke  MRA NECK WITHOUT AND WITH CONTRAST  Technique:  Angiographic images of the neck were obtained using MRA technique without and with intravenous contrast.  Carotid stenosis measurements (when applicable) are obtained utilizing NASCET criteria, using the distal internal carotid diameter as the denominator.  Contrast: 15mL MULTIHANCE GADOBENATE DIMEGLUMINE 529 MG/ML IV SOLN  Comparison:  MRI head 10/12/2011  Findings:  Both vertebral arteries are widely patent without significant stenosis.  Right vertebral artery is dominant.  Carotid artery is widely patent bilaterally.  Negative for stenosis or dissection.  IMPRESSION: Negative  Original Report Authenticated By: Camelia Phenes, M.D.   Mr Laqueta Jean Wo Contrast  10/12/2011  *RADIOLOGY REPORT*  Clinical Data:  Stroke.  Right facial droop  MRI HEAD WITHOUT AND WITH CONTRAST MRA HEAD WITHOUT CONTRAST  Technique:  Multiplanar, multiecho pulse sequences of the brain and surrounding structures were obtained without and with intravenous contrast.  Angiographic images of the head were obtained using MRA technique without contrast.  Contrast: 10mL MULTIHANCE GADOBENATE DIMEGLUMINE 529 MG/ML IV SOLN  Comparison:  CT 10/12/2011  MRI HEAD WITHOUT AND WITH CONTRAST  Findings:  Restricted diffusion in the subcortical white matter in the left posterior frontal lobe.  No other acute infarct is identified.  Age appropriate atrophy.  Chronic microvascular ischemic changes in the white  matter and pons.  Small areas of chronic ischemia in the basal ganglia bilaterally.  Benign 12 mm cyst in the right temporal lobe.  Negative for hemorrhage or mass.  Postcontrast imaging reveals normal enhancement.  Negative for neoplasm. Sinusitis.  IMPRESSION: Acute infarct in the left posterior frontal subcortical white matter.  Chronic microvascular ischemic changes in the white matter and pons.  MRA HEAD  Findings: Both vertebral arteries are patent to the basilar. Bilateral PICA, superior cerebellar, and posterior cerebral arteries are patent without stenosis.  The basilar is widely patent.  Internal carotid artery is widely patent without stenosis. Anterior and  middle cerebral arteries are normal.  Negative for aneurysm.  IMPRESSION: Normal  Original Report Authenticated By: Camelia Phenes, M.D.    CT of the brain    IMPRESSION:  No intracranial hemorrhage or CT evidence of large acute infarct.  CT angio  not ordered  MRI of the brain   IMPRESSION:  Acute infarct in the left posterior frontal subcortical white  matter.   MRA of the brain   IMPRESSION:  Normal  MRA Neck  IMPRESSION:  Negative  2D Echocardiogram  Pending, done  Carotid Doppler not ordered  CXR   IMPRESSION:  No acute cardiopulmonary disease.    EKG   SINUS RHYTHM ~ normal P axis, V-rate 50- 99 CONSIDER RIGHT ATRIAL ABNORMALITY ~ P >0.31mV limb lead No previous ECGs available  Physical Exam   The patient is alert and cooperative.  Neurologic exam reveals full extraocular movements, speech is normal. Visual fields are full.  Motor testing reveals good strength of all four extremities.  The patient has good finger-nose-finger and heel-to-shin bilaterally. Gait was not tested.  Deep tendon reflexes are symmetric and normal. Toes are down going bilaterally.  Speech is slightly dysarthric, not aphasic. Facial asymmetry is noted, decreased right nasolabial fold.      ASSESSMENT [name]  is a 78 y.o. female with a left posterior frontal stroke, secondary to possible embolism. On aspirin 81 mg orally every day for secondary stroke prevention prior to admission.  Stroke risk factors:  atrial fibrillation, hyperlipidemia and hypertension  Atrial fibrillation is listed as a problem in the medical history, but the patient indicates that she has never had this documented. The patient does report an almost lifelong history of palpitations. The stroke seen by MRI could be cardioembolic. The 2-D echocardiogram and a carotid Doppler study are pending.  In the future, a TEE and a prolonged cardiac  monitoring study may be indicated.  Hospital day # 2  TREATMENT/PLAN Continue aspirin 325 mg orally every day for secondary stroke prevention.   2-D echocardiogram pending, study was done.   MRA of the neck was done, and is unremarkable.  The patient is to get up with assistance. The patient likely does not need much in the way of physical and occupational therapy.  I will contact [facility]. Patient will be put on schedule for a TEE study. The patient we made n.p.o. for tonight.  The patient once again may require prolonged monitoring as an outpatient.   Lesly Dukes

## 2011-10-15 ENCOUNTER — Encounter (HOSPITAL_COMMUNITY)
Admission: EM | Disposition: A | Discharge status: Home / Self Care | Payer: Self-pay | Source: Home / Self Care | Attending: Family Medicine

## 2011-10-15 ENCOUNTER — Encounter (HOSPITAL_COMMUNITY): Payer: Self-pay

## 2011-10-15 HISTORY — PX: TEE WITHOUT CARDIOVERSION: SHX5443

## 2011-10-15 LAB — DIFFERENTIAL
Basophils Absolute: 0 10*3/uL (ref 0.0–0.1)
Basophils Relative: 0 % (ref 0–1)
Eosinophils Absolute: 0.4 10*3/uL (ref 0.0–0.7)
Eosinophils Relative: 2 % (ref 0–5)
Lymphocytes Relative: 75 % — ABNORMAL HIGH (ref 12–46)
Lymphs Abs: 14.3 10*3/uL — ABNORMAL HIGH (ref 0.7–4.0)
Monocytes Absolute: 1 10*3/uL (ref 0.1–1.0)
Monocytes Relative: 5 % (ref 3–12)
Neutro Abs: 3.5 10*3/uL (ref 1.7–7.7)
Neutrophils Relative %: 18 % — ABNORMAL LOW (ref 43–77)

## 2011-10-15 LAB — CBC
HCT: 41.4 % (ref 36.0–46.0)
Hemoglobin: 14 g/dL (ref 12.0–15.0)
MCH: 30.7 pg (ref 26.0–34.0)
MCHC: 33.8 g/dL (ref 30.0–36.0)
MCV: 90.8 fL (ref 78.0–100.0)
Platelets: 210 10*3/uL (ref 150–400)
RBC: 4.56 MIL/uL (ref 3.87–5.11)
RDW: 13.4 % (ref 11.5–15.5)
WBC: 19.2 10*3/uL — ABNORMAL HIGH (ref 4.0–10.5)

## 2011-10-15 LAB — PATHOLOGIST SMEAR REVIEW

## 2011-10-15 SURGERY — ECHOCARDIOGRAM, TRANSESOPHAGEAL
Anesthesia: Moderate Sedation

## 2011-10-15 MED ORDER — LIDOCAINE VISCOUS 2 % MT SOLN
OROMUCOSAL | Status: DC | PRN
  Administered 2011-10-15: 5 mL via OROMUCOSAL

## 2011-10-15 MED ORDER — MIDAZOLAM HCL 10 MG/2ML IJ SOLN
INTRAMUSCULAR | Status: AC
  Filled 2011-10-15: qty 4

## 2011-10-15 MED ORDER — BENZOCAINE 20 % MT SOLN: 1.0000 "application " | OROMUCOSAL | Status: DC | PRN

## 2011-10-15 MED ORDER — SODIUM CHLORIDE 0.9 % IJ SOLN: 3.0000 mL | Freq: Two times a day (BID) | INTRAMUSCULAR | Status: DC

## 2011-10-15 MED ORDER — SODIUM CHLORIDE 0.9 % IV SOLN: 250.0000 mL | INTRAVENOUS | Status: DC | PRN

## 2011-10-15 MED ORDER — FENTANYL CITRATE 0.05 MG/ML IJ SOLN
INTRAMUSCULAR | Status: AC
  Filled 2011-10-15: qty 4

## 2011-10-15 MED ORDER — SODIUM CHLORIDE 0.9 % IJ SOLN: 3.0000 mL | INTRAMUSCULAR | Status: DC | PRN

## 2011-10-15 MED ORDER — SODIUM CHLORIDE 0.45 % IV SOLN
INTRAVENOUS | Status: DC
  Administered 2011-10-15: 500 mL via INTRAVENOUS

## 2011-10-15 MED ORDER — ASPIRIN 325 MG PO TABS: 325.0000 mg | ORAL_TABLET | Freq: Every day | ORAL | Status: AC

## 2011-10-15 MED ORDER — ATORVASTATIN CALCIUM 40 MG PO TABS: 40.0000 mg | ORAL_TABLET | Freq: Every evening | ORAL | Status: DC

## 2011-10-15 MED ORDER — FENTANYL CITRATE 0.05 MG/ML IJ SOLN: 250.0000 ug | Freq: Once | INTRAMUSCULAR | Status: DC

## 2011-10-15 MED ORDER — FENTANYL CITRATE 0.05 MG/ML IJ SOLN
INTRAMUSCULAR | Status: DC | PRN
  Administered 2011-10-15 (×2): 25 ug via INTRAVENOUS

## 2011-10-15 MED ORDER — MIDAZOLAM HCL 10 MG/2ML IJ SOLN: 10.0000 mg | Freq: Once | INTRAMUSCULAR | Status: DC

## 2011-10-15 MED ORDER — LIDOCAINE VISCOUS 2 % MT SOLN
OROMUCOSAL | Status: AC
  Filled 2011-10-15: qty 15

## 2011-10-15 MED ORDER — ASPIRIN 325 MG PO TABS: 325.0000 mg | ORAL_TABLET | Freq: Every day | ORAL | Status: DC

## 2011-10-15 MED ORDER — DIPHENHYDRAMINE HCL 50 MG/ML IJ SOLN
INTRAMUSCULAR | Status: AC
  Filled 2011-10-15: qty 1

## 2011-10-15 MED ORDER — MIDAZOLAM HCL 10 MG/2ML IJ SOLN
INTRAMUSCULAR | Status: DC | PRN
  Administered 2011-10-15 (×2): 2 mg via INTRAVENOUS

## 2011-10-15 NOTE — Progress Notes (Signed)
Stroke Team Progress Note  HISTORY Was totally normal until 14:00pm on 10/12/2011; she was doing paperwork. Went to the calender to see what was going on and was trying to relay this to her Kelli Lucas and then had some sluured speech. This has never happened before. No LOS, no seizures , abnormal movements of the body, no CP, no headache,  no pains. Kelli Lucas. She did note some thickness of tongue. she had a good's night rest overnight, and told by her family doctor that she needed to see him and was directed to the ED. Patient was not a TPA candidate secondary to delay in arrival. she was admitted for further evaluation and treatment.  SUBJECTIVE Her Kelli Lucas is at the bedside. Overall she feels her condition is gradually improving. The patient and family are anxious for discharge today after the TEE.  OBJECTIVE Most recent Vital Signs: Temp: 98.8 F (37.1 C) (04/08 1000) Temp src: Oral (04/08 1000) BP: 147/73 mmHg (04/08 1000) Pulse Rate: 67  (04/08 1000) Respiratory Rate: 16 O2 Saturation: 97%  CBG (last 3)   Basename 10/13/11 0708 10/12/11 2141  GLUCAP 80 108*   Intake/Output from previous day:   IV Fluid Intake:    Medications    . aspirin  325 mg Oral Daily  . atorvastatin  40 mg Oral QPM  . cholecalciferol  1,000 Units Oral Daily  . heparin  5,000 Units Subcutaneous Q8H  . metoprolol succinate  50 mg Oral Daily  . mulitivitamin with minerals  1 tablet Oral Daily  . omega-3 acid ethyl esters  1 g Oral BID  PRN   Diet:  NPO for TEE Activity: Up with assistance DVT Prophylaxis:  Subcutaneous heparin  Significant Diagnostic Studies: CBC    Component Value Date/Time   WBC 19.2* 10/15/2011 0500   RBC 4.56 10/15/2011 0500   HGB 14.0 10/15/2011 0500   HCT 41.4 10/15/2011 0500   PLT 210 10/15/2011 0500   MCV 90.8 10/15/2011 0500   MCH 30.7 10/15/2011 0500   MCHC 33.8 10/15/2011 0500   RDW 13.4 10/15/2011 0500   LYMPHSABS 14.3* 10/15/2011 0500   MONOABS 1.0 10/15/2011  0500   EOSABS 0.4 10/15/2011 0500   BASOSABS 0.0 10/15/2011 0500   CMP    Component Value Date/Time   NA 144 10/12/2011 0938   K 4.0 10/12/2011 0938   CL 106 10/12/2011 0938   CO2 27 10/12/2011 0921   GLUCOSE 99 10/12/2011 0938   BUN 16 10/12/2011 0938   CREATININE 0.80 10/12/2011 0938   CALCIUM 10.0 10/12/2011 0921   PROT 7.2 10/12/2011 0921   ALBUMIN 3.9 10/12/2011 0921   AST 27 10/12/2011 0921   ALT 22 10/12/2011 0921   ALKPHOS 77 10/12/2011 0921   BILITOT 0.5 10/12/2011 0921   GFRNONAA 79* 10/12/2011 0921   GFRAA >90 10/12/2011 0921   COAGS Lab Results  Component Value Date   INR 0.93 10/12/2011   Lipid Panel    Component Value Date/Time   CHOL 154 10/13/2011 0500   TRIG 105 10/13/2011 0500   HDL 47 10/13/2011 0500   CHOLHDL 3.3 10/13/2011 0500   VLDL 21 10/13/2011 0500   LDLCALC 86 10/13/2011 0500   HgbA1C  Lab Results  Component Value Date   HGBA1C 5.4 10/12/2011   Urine Drug Screen     Component Value Date/Time   LABOPIA NONE DETECTED 10/14/2011 4696    Alcohol Level No results found for this basename: eth   CT of the  brain   No intracranial hemorrhage or CT evidence of large acute infarct.  MRI of the brain  Acute infarct in the left posterior frontal subcortical white matter.  MRA of the brain  Normal  MRA Neck Negative  2D Echocardiogram  EF 55-60% with no source of embolus.   Carotid Doppler see MRA neck  TEE ordered   CXR  No acute cardiopulmonary disease.   EKG   SINUS RHYTHM ~ normal P axis, V-rate 50- 99 CONSIDER RIGHT ATRIAL ABNORMALITY ~ P >0.31mV limb lead No previous ECGs available  Physical Exam   Awake alert. Afebrile. Head is nontraumatic. Neck is supple without bruit. Hearing is normal. Cardiac exam no murmur or gallop. Lungs are clear to auscultation. Distal pulses are well felt.   Neurologic exam The patient is alert and cooperative.   full extraocular movements, speech is normal. Visual fields are full. Motor testing reveals good strength of all four extremities. The  patient has good finger-nose-finger and heel-to-shin bilaterally. Gait was not tested. Deep tendon reflexes are symmetric and normal. Toes are down going bilaterally. Speech is slightly dysarthric, not aphasic. Facial asymmetry is noted, decreased right nasolabial fold.  ASSESSMENT [name] is a 78 y.o. female with a left posterior frontal stroke, secondary to possible embolism. On aspirin 81 mg orally every day for secondary stroke prevention prior to admission. Now on aspirin 325 mg daily.  Stroke risk factors:  atrial fibrillation, hyperlipidemia and hypertension  Atrial fibrillation is listed as a problem in the medical history, but the patient indicates that she has never had this documented. The patient does report an almost lifelong history of palpitations. The stroke seen by MRI could be cardioembolic.   Hospital day # 3  TREATMENT/PLAN -Continue aspirin 325 mg orally every day for secondary stroke prevention.  -[facility] for TEE today 2p.to look for embolic source. If positive for PFO (patent foramen ovale), needs bilateral lower extremity dopplers to rule out DVT as source of stroke. -Please schedule outpatient telemetry monitoring to assess patient for atrial fibrillation as source of stroke. May be arranged with patient's cardiologist, or cardiologist of choice.  -ok for discharge later today if TEE not significant  Kelli Lucas, AVNP, ANP-BC, GNP-BC [facility] Pager: [phone] 10/15/2011 11:06 AM  Dr. Delia Heady, Stroke Center Medical Director, has personally reviewed chart, pertinent data, examined the patient and developed the plan of care.

## 2011-10-15 NOTE — Evaluation (Signed)
Speech Language Pathology Evaluation Patient Details Name: Kelli Lucas MRN: [id number] DOB: June 05, 1930 Today's Date: 10/15/2011  Problem List:  Patient Active Problem List  Diagnoses  . CVA-L frontal area 4/6  . Paroxysmal atrial fibrillation  . HLD (hyperlipidemia)  . Leukemia, chronic lymphoid  . Hypertension   Past Medical History:  Past Medical History  Diagnosis Date  . Hypertension   . Leukemia, chronic lymphoid     chronic lymphocytis Leukemia-MD Ottis Stain, Dalene Seltzer had this for about about 1 yr.   Is no t on any current therapy-is being watcvhed  . HLD (hyperlipidemia)     takes lipitor-last lipid panel about 3 mo ago, and was good  . Paroxysmal atrial fibrillation     takes  asa for this-"most of her life"  . Panic attacks    Past Surgical History:  Past Surgical History  Procedure Date  . Breast lumpectomy     SLP Assessment/Plan/Recommendation Assessment Clinical Impression Statement: 78 year old female, admitted 10/12/11 due to slurred speech and right facial droop.  MRI indicated left posterior frontal subcortical white matter infarct.  Pt presents with intact language and cognition.  Mild dysarthria is noted, due to slight oral motor weakness, and decreased ROM and sensation.  Unable to complete BSE at this time due to pt NPO for procedure, however, pt does not report pharyngeal symptoms of dysphagia.  She does report occasional anterior leakage and biting the inside of her lip, due to weakness and poor sensation.  Pt was given written exercises to increase oral motor strength, and was provided with strategies to increase clarity of speech.  Her speech is fully intelligible, but is slightly slurred.  Pt was given opportunity to ask questions.  No further acute ST intervention is warranted at this point.  Pt may benefit from OP speech therapy for neuromuscular stimulation of the facial musculature.   SLP Recommendation/Assessment: All further Speech Lanaguage Pathology  needs  can be addressed in the next venue of care SLP Recommendations Follow up Recommendations: Outpatient SLP Equipment Recommended: None recommended by SLP Individuals Consulted Consulted and Agree with Results and Recommendations: Patient  SLP Evaluation Prior Functioning Cognitive/Linguistic Baseline: Within functional limits Lives With: Spouse Vocation: Retired  IT consultant Overall Cognitive Status: Appears within functional limits for tasks assessed Arousal/Alertness: Awake/alert Orientation Level: Oriented X4 Attention:  (WFL) Memory: Appears intact Awareness: Appears intact Problem Solving: Appears intact Executive Function:  (appears intact) Safety/Judgment: Appears intact  Comprehension Auditory Comprehension Overall Auditory Comprehension: Appears within functional limits for tasks assessed Yes/No Questions: Within Functional Limits Commands: Within Functional Limits Conversation: Complex Visual Recognition/Discrimination Discrimination: Within Function Limits Reading Comprehension Reading Status: Within funtional limits  Expression Expression Primary Mode of Expression: Verbal Verbal Expression Overall Verbal Expression: Appears within functional limits for tasks assessed Initiation: No impairment Level of Generative/Spontaneous Verbalization: Conversation Repetition: No impairment Naming: No impairment Pragmatics: No impairment Written Expression Dominant Hand: Right Written Expression: Not tested  Oral/Motor Oral Motor/Sensory Function Overall Oral Motor/Sensory Function: Impaired Labial ROM: Reduced right Labial Symmetry: Abnormal symmetry right Labial Strength: Reduced Labial Sensation: Reduced Lingual ROM: Within Functional Limits Lingual Symmetry: Within Functional Limits Lingual Strength: Reduced Lingual Sensation: Reduced Facial ROM: Reduced right Facial Symmetry: Right droop Facial Strength: Reduced Facial Sensation: Reduced Velum: Within  Functional Limits Mandible: Within Functional Limits Motor Speech Overall Motor Speech: Impaired Respiration: Within functional limits Phonation: Normal Resonance: Within functional limits Articulation: Impaired Level of Impairment: Conversation Intelligibility: Intelligibility reduced Word: 75-100% accurate Phrase: 75-100% accurate Sentence: 75-100% accurate  Conversation: 75-100% accurate Motor Planning: Witnin functional limits Motor Speech Errors: Not applicable Effective Techniques: Slow rate (overarticulation) Klover Priestly B. Murvin Natal [facility], CCC-SLP [phone] Pager [phone]   Leigh Aurora 10/15/2011, 11:06 AM

## 2011-10-15 NOTE — Discharge Summary (Signed)
Date of Admission: 10/12/2011  9:01 AM Admitter: @ADMITPROV @   Date of Discharge4/02/2012 Attending Physician: Rhetta Mura, MD  Things to Follow-up on: Follow up on event monitor results-might change needs for anticoagulation/antiplatelet therapy  Needs lipid panel checked in the outpatient setting    [facility] Discharge Summary Kelli Lucas WUJ:[id number] DOB: Nov 25, 1929 DOA: 76/11/2011 PCP: Pamelia Hoit, MD, MD  Brief narrative: 78 year old Caucasian female admitted 4/5 with symptoms of stroke.  Patient was totally normal at home until 1400 at the [place] paperwork and when she went to counter strength relay some issues her husband she noted sudden speech which never happened before. The patient denied loss of consciousness, seizure, abnormal movement body, chest pain, headache, pain + thickness of tongue and was sent to emergency room for MRI showed left posterior frontal acute infarct  Past medical history: CLL, hyperlipidemia, hypertension, panic attacks, ? 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   CVA-L frontal area 4/6-Order MRI/A brain/neck. Await work-up per Neuro.  Plavix now changed to aspirin 325 mg daily. ?P Afib-CHAD score 4= 8.5% if no coumadin-given history of this tachycardia and questionable history of AFIB agreed to keep on  telemetry and will arrange for outpatient event monitor.    TEE was done 4/82 which showed no evidence of any itch or fibrillation versus thrombus or PFO. Will get TSH in am as awakens some night with sweats and anxiety. Had a speech therapy give her some cues for help with regards to recommendations for speech modalities. Will get event monitor placed prior to discharge by cardiology   .   ?Paroxysmal atrial fibrillation-Controlled on Metoprolol 50 mg XL.   on telemetry so far she's been sinus rhythm without any pauses or alarms or evidence of age fibrillation nevertheless we will keep on event monitor and is four-week and reevaluate   .   HLD (hyperlipidemia)-LDL controlled at 86, HDL is 47-increase Lipitor to 40 mg every afternoon  , A1c was 5.7 we'll discontinue blood sugar checks    .   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 Could cause a thrombogenic state. Hold on Hematology consult.  RPt CBC am-had leucocytosis on admit that could be related to this-and this persisted. She will followup with her regular oncologist Dr. Ottis Stain.      Marland Kitchen   Hypertension-uncontrolled we will reimplement metoprolol as per above-slightly lower pressures.  Might need lower dose metoprolol.      Procedures Performed and pertinent labs: Dg Chest 2 View  10/12/2011  *RADIOLOGY REPORT*  Clinical Data: Cerebrovascular accident (right-sided facial drooping)  CHEST - 2 VIEW  Comparison: None.  Findings: Normal cardiac silhouette and mediastinal contours.  No focal airspace opacity.  No pleural effusion or pneumothorax.  No acute osseous abnormalities.  IMPRESSION: No acute cardiopulmonary disease.  Original Report Authenticated By: Waynard Reeds, M.D.   Ct Head Wo Contrast  10/12/2011  *  RADIOLOGY REPORT*  Clinical Data: Right-sided facial droop since yesterday.  Weakness.  CT HEAD WITHOUT CONTRAST  Technique:  Contiguous axial images were obtained from the base of the skull through the vertex without  contrast.  Comparison: None.  Findings: Opacification right sphenoid sinus air cell.  No intracranial hemorrhage.  Small vessel disease type changes.  No CT evidence of large acute infarct.  Atrophy without hydrocephalus.  No intracranial mass lesion detected on this unenhanced exam.  Vascular calcifications.  IMPRESSION: No intracranial hemorrhage or CT evidence of large acute infarct.  Small vessel disease type changes.  Atrophy without hydrocephalus.  Vascular calcifications.  Right sphenoid sinus air cell opacification.  Original Report Authenticated By: Fuller Canada, M.D.   Mr Angiogram Almyra Deforest Wo Contrast  10/12/2011  *RADIOLOGY REPORT*  Clinical Data:  Stroke.  Right facial droop  MRI HEAD WITHOUT AND WITH CONTRAST MRA HEAD WITHOUT CONTRAST  Technique:  Multiplanar, multiecho pulse sequences of the brain and surrounding structures were obtained without and with intravenous contrast.  Angiographic images of the head were obtained using MRA technique without contrast.  Contrast: 10mL MULTIHANCE GADOBENATE DIMEGLUMINE 529 MG/ML IV SOLN  Comparison:  CT 10/12/2011  MRI HEAD WITHOUT AND WITH CONTRAST  Findings:  Restricted diffusion in the subcortical white matter in the left posterior frontal lobe.  No other acute infarct is identified.  Age appropriate atrophy.  Chronic microvascular ischemic changes in the white matter and pons.  Small areas of chronic ischemia in the basal ganglia bilaterally.  Benign 12 mm cyst in the right temporal lobe.  Negative for hemorrhage or mass.  Postcontrast imaging reveals normal enhancement.  Negative for neoplasm. Sinusitis.  IMPRESSION: Acute infarct in the left posterior frontal subcortical white matter.  Chronic microvascular ischemic changes in the white matter and pons.  MRA HEAD  Findings: Both vertebral arteries are patent to the basilar. Bilateral PICA, superior cerebellar, and posterior cerebral arteries are patent without stenosis.  The basilar is widely patent.   Internal carotid artery is widely patent without stenosis. Anterior and  middle cerebral arteries are normal.  Negative for aneurysm.  IMPRESSION: Normal  Original Report Authenticated By: Camelia Phenes, M.D.   Mr Angiogram Neck W Wo Contrast  10/13/2011  *RADIOLOGY REPORT*  Clinical Data:  Stroke  MRA NECK WITHOUT AND WITH CONTRAST  Technique:  Angiographic images of the neck were obtained using MRA technique without and with intravenous contrast.  Carotid stenosis measurements (when applicable) are obtained utilizing NASCET criteria, using the distal internal carotid diameter as the denominator.  Contrast: 15mL MULTIHANCE GADOBENATE DIMEGLUMINE 529 MG/ML IV SOLN  Comparison:  MRI head 10/12/2011  Findings:  Both vertebral arteries are widely patent without significant stenosis.  Right vertebral artery is dominant.  Carotid artery is widely patent bilaterally.  Negative for stenosis or dissection.  IMPRESSION: Negative  Original Report Authenticated By: Camelia Phenes, M.D.   Mr Laqueta Jean Wo Contrast  10/12/2011  *RADIOLOGY REPORT*  Clinical Data:  Stroke.  Right facial droop  MRI HEAD WITHOUT AND WITH CONTRAST MRA HEAD WITHOUT CONTRAST  Technique:  Multiplanar, multiecho pulse sequences of the brain and surrounding structures were obtained without and with intravenous contrast.  Angiographic images of the head were obtained using MRA technique without contrast.  Contrast: 10mL MULTIHANCE GADOBENATE DIMEGLUMINE 529 MG/ML IV SOLN  Comparison:  CT 10/12/2011  MRI HEAD WITHOUT AND WITH CONTRAST  Findings:  Restricted diffusion in the subcortical white matter in the left posterior frontal  lobe.  No other acute infarct is identified.  Age appropriate atrophy.  Chronic microvascular ischemic changes in the white matter and pons.  Small areas of chronic ischemia in the basal ganglia bilaterally.  Benign 12 mm cyst in the right temporal lobe.  Negative for hemorrhage or mass.  Postcontrast imaging reveals normal  enhancement.  Negative for neoplasm. Sinusitis.  IMPRESSION: Acute infarct in the left posterior frontal subcortical white matter.  Chronic microvascular ischemic changes in the white matter and pons.  MRA HEAD  Findings: Both vertebral arteries are patent to the basilar. Bilateral PICA, superior cerebellar, and posterior cerebral arteries are patent without stenosis.  The basilar is widely patent.  Internal carotid artery is widely patent without stenosis. Anterior and  middle cerebral arteries are normal.  Negative for aneurysm.  IMPRESSION: Normal  Original Report Authenticated By: Camelia Phenes, M.D.    Discharge Vitals & PE:  BP 124/60  Pulse 67  Temp(Src) 97.5 F (36.4 C) (Oral)  Resp 18  Ht 5' (1.524 m)  Wt 56.7 kg (125 lb)  BMI 24.41 kg/m2  SpO2 96% HEENT-alert, oriented. Some slurring of speech, Although slightly intelligible  CHEST-cta b, no added sound  CARDS-s1 s2 no m/r/g, Rrr, Tele sinus  ABD-soft, nt, nd  SKIN-no rash noted  NEURO-alert, oriented x3  speech: normal in context and clarity-still a little slurred but improved  memory: intact grossly  cranial nerves II-XII: intact  motor strength: full proximally and distally    Discharge Labs:  Results for orders placed during the hospital encounter of 10/12/11 (from the past 24 hour(s))  CBC     Status: Abnormal   Collection Time   10/15/11  5:00 AM      Component Value Range   WBC 19.2 (*) 4.0 - 10.5 (K/uL)   RBC 4.56  3.87 - 5.11 (MIL/uL)   Hemoglobin 14.0  12.0 - 15.0 (g/dL)   HCT 16.1  09.6 - 04.5 (%)   MCV 90.8  78.0 - 100.0 (fL)   MCH 30.7  26.0 - 34.0 (pg)   MCHC 33.8  30.0 - 36.0 (g/dL)   RDW 40.9  81.1 - 91.4 (%)   Platelets 210  150 - 400 (K/uL)  DIFFERENTIAL     Status: Abnormal   Collection Time   10/15/11  5:00 AM      Component Value Range   Neutrophils Relative 18 (*) 43 - 77 (%)   Lymphocytes Relative 75 (*) 12 - 46 (%)   Monocytes Relative 5  3 - 12 (%)   Eosinophils Relative 2  0 - 5 (%)    Basophils Relative 0  0 - 1 (%)   Neutro Abs 3.5  1.7 - 7.7 (K/uL)   Lymphs Abs 14.3 (*) 0.7 - 4.0 (K/uL)   Monocytes Absolute 1.0  0.1 - 1.0 (K/uL)   Eosinophils Absolute 0.4  0.0 - 0.7 (K/uL)   Basophils Absolute 0.0  0.0 - 0.1 (K/uL)   WBC Morphology ATYPICAL LYMPHOCYTES      Disposition and follow-up:   Kelli Lucas was discharged from in good condition.    Follow-up Appointments:  Follow-up Information    Follow up with Pamelia Hoit, MD in 1 week.      Follow up with Lesly Dukes, MD in 4 weeks.   Contact information:   [address] [place] [address] [facility] [place] [address] [phone]          Discharge Medications: Medication  List  As of 10/15/2011  4:52 PM   STOP taking these medications         aspirin EC 81 MG tablet         TAKE these medications         aspirin 325 MG tablet   Take 1 tablet (325 mg total) by mouth daily.      atorvastatin 40 MG tablet   Commonly known as: LIPITOR   Take 1 tablet (40 mg total) by mouth every evening.      cholecalciferol 1000 UNITS tablet   Commonly known as: VITAMIN D   Take 1,000 Units by mouth daily.      fish oil-omega-3 fatty acids 1000 MG capsule   Take 1 g by mouth 2 (two) times daily.      metoprolol succinate 50 MG 24 hr tablet   Commonly known as: TOPROL-XL   Take 50 mg by mouth daily. Take with or immediately following a meal.      multivitamins ther. w/minerals Tabs   Take 1 tablet by mouth daily.           Medications Discontinued During This Encounter  Medication Reason  . fish oil-omega-3 fatty acids capsule 1 g Formulary change  . multivitamins ther. w/minerals tablet 1 tablet Formulary change  . metoprolol (LOPRESSOR) injection 2.5 mg   . atorvastatin (LIPITOR) tablet 10 mg   . atorvastatin (LIPITOR) 10 MG tablet Stop Taking at Discharge  . aspirin EC 81 MG tablet Stop Taking at Discharge  . aspirin 325 MG tablet   . 0.45 % sodium  chloride infusion   . 0.9 %  sodium chloride infusion   . benzocaine (HURRICAINE) 20 % mouth spray 1 application   . fentaNYL (SUBLIMAZE) injection 250 mcg   . fentaNYL (SUBLIMAZE) injection   . lidocaine (XYLOCAINE) 2 % viscous mouth solution   . midazolam (VERSED) injection   . midazolam (VERSED) injection 10 mg   . sodium chloride 0.9 % injection 3 mL   . sodium chloride 0.9 % injection 3 mL    > 40 minutes time spent preparing d/c summary, including direct face-face patient Time, contact with consultants, family and care coordination   Signed: Mahalia Dykes,JAI 10/15/2011, 4:52 PM

## 2011-10-15 NOTE — Progress Notes (Signed)
  Echocardiogram Echocardiogram Transesophageal has been performed.  Jorje Guild [facility] 10/15/2011, 3:59 PM

## 2011-10-15 NOTE — Progress Notes (Signed)
   CARE MANAGEMENT NOTE 10/15/2011  Patient:  Kelli Lucas,Kelli Lucas   Account Number:  [id number]  Date Initiated:  10/15/2011  Documentation initiated by:  Onnie Boer  Subjective/Objective Assessment:   PT WAS ADMITTED WITH CVA     Action/Plan:   PROGRESSION OFCARE AND DISCHARGE PLANNING   Anticipated DC Date:  10/15/2011   Anticipated DC Plan:  HOME/SELF CARE      DC Planning Services  CM consult      Choice offered to / List presented to:             Status of service:  Completed, signed off Medicare Important Message given?   (If response is "NO", the following Medicare IM given date fields will be blank) Date Medicare IM given:   Date Additional Medicare IM given:    Discharge Disposition:  HOME/SELF CARE  Per UR Regulation:  Reviewed for med. necessity/level of care/duration of stay  If discussed at Long Length of Stay Meetings, dates discussed:    Comments:  10/15/11 Onnie Boer, RN, BSN 1647 PT WAS DC' D TO HOME WITH SELF CARE

## 2011-10-15 NOTE — Brief Op Note (Signed)
10/12/2011 - 10/15/2011  3:41 PM  PATIENT:  Kelli Lucas  78 y.o. female  PRE-OPERATIVE DIAGNOSIS:  CVA/stroke  POST-OPERATIVE DIAGNOSIS:  negative bubble study  PROCEDURE:  Procedue(s) (LRB): TRANSESOPHAGEAL ECHOCARDIOGRAM (TEE) (N/A)  SURGEON:  Surgeon(s) and Role:    * Pricilla Riffle, MD - Primary   MV normal.  Trace MR AV mildly thickened.  NO AI TV normal  Trace TR.   PV normal. Interatrial septum is aneurysmal but there is no PFO by color doppler or with injection of agitated saline with cough.  There are a few small bubbles late consistent with intrapulmonary shunt.   LA LAA without masses Normal LV function Mild fixed plaque in thoracic aorta.

## 2011-10-15 NOTE — Plan of Care (Signed)
Problem: Phase II Progression Outcomes Goal: Other Phase II Outcomes/Goals Outcome: Completed/Met Date Met:  10/15/11 Complete SLE and provide home exercise program. Kelli Lucas B. Kelli Lucas, MSP, CCC-SLP [phone]

## 2011-10-15 NOTE — Discharge Instructions (Signed)
STROKE/TIA DISCHARGE INSTRUCTIONS SMOKING Cigarette smoking nearly doubles your risk of having a stroke & is the single most alterable risk factor  If you smoke or have smoked in the last 12 months, you are advised to quit smoking for your health.  Most of the excess cardiovascular risk related to smoking disappears within a year of stopping.  Ask you doctor about anti-smoking medications  [place] Quit Line: 1-800-QUIT NOW  Free Smoking Cessation Classes [phone]  CHOLESTEROL Know your levels; limit fat & cholesterol in your diet  Lipid Panel     Component Value Date/Time   CHOL 154 10/13/2011 0500   TRIG 105 10/13/2011 0500   HDL 47 10/13/2011 0500   CHOLHDL 3.3 10/13/2011 0500   VLDL 21 10/13/2011 0500   LDLCALC 86 10/13/2011 0500      Many patients benefit from treatment even if their cholesterol is at goal.  Goal: Total Cholesterol (CHOL) less than 160  Goal:  Triglycerides (TRIG) less than 150  Goal:  HDL greater than 40  Goal:  LDL (LDLCALC) less than 100   BLOOD PRESSURE American Stroke Association blood pressure target is less that 120/80 mm/Hg  Your discharge blood pressure is:  BP: 138/56 mmHg  Monitor your blood pressure  Limit your salt and alcohol intake  Many individuals will require more than one medication for high blood pressure  DIABETES (A1c is a blood sugar average for last 3 months) Goal HGBA1c is under 7% (HBGA1c is blood sugar average for last 3 months)  Diabetes: {STROKE DC DIABETES:22357}    Lab Results  Component Value Date   HGBA1C 5.4 10/12/2011     Your HGBA1c can be lowered with medications, healthy diet, and exercise.  Check your blood sugar as directed by your physician  Call your physician if you experience unexplained or low blood sugars.  PHYSICAL ACTIVITY/REHABILITATION Goal is 30 minutes at least 4 days per week    {STROKE DC ACTIVITY/REHAB:22359}  Activity decreases your risk of heart attack and stroke and makes your heart stronger.  It  helps control your weight and blood pressure; helps you relax and can improve your mood.  Participate in a regular exercise program.  Talk with your doctor about the best form of exercise for you (dancing, walking, swimming, cycling).  DIET/WEIGHT Goal is to maintain a healthy weight  Your discharge diet is: NPO *** liquids Your height is:  Height: 5' (152.4 cm) Your current weight is: Weight: 56.7 kg (125 lb) Your Body Mass Index (BMI) is:  BMI (Calculated): 24.5   Following the type of diet specifically designed for you will help prevent another stroke.  Your goal weight range is:  ***  Your goal Body Mass Index (BMI) is 19-24.  Healthy food habits can help reduce 3 risk factors for stroke:  High cholesterol, hypertension, and excess weight.  RESOURCES Stroke/Support Group:  Call [phone]  they meet the 3rd Sunday of the month on the Rehab Unit at [facility], [place] ( no meetings June, July & Aug).  STROKE EDUCATION PROVIDED/REVIEWED AND GIVEN TO PATIENT Stroke warning signs and symptoms How to activate emergency medical system (call 911). Medications prescribed at discharge. Need for follow-up after discharge. Personal risk factors for stroke. Pneumonia vaccine given:   {STROKE DC YES/NO/DATE:22363} Flu vaccine given:   {STROKE DC YES/NO/DATE:22363} My questions have been answered, the writing is legible, and I understand these instructions.  I will adhere to these goals & educational materials that have been provided to  me after my discharge from the hospital.

## 2011-10-15 NOTE — Brief Op Note (Signed)
Full report in [facility] 

## 2011-10-16 ENCOUNTER — Encounter (HOSPITAL_COMMUNITY): Payer: Self-pay | Admitting: Internal Medicine

## 2011-10-19 ENCOUNTER — Encounter (INDEPENDENT_AMBULATORY_CARE_PROVIDER_SITE_OTHER): Payer: Medicare Other

## 2011-10-19 DIAGNOSIS — I4891 Unspecified atrial fibrillation: Secondary | ICD-10-CM

## 2011-10-23 NOTE — Op Note (Signed)
Full report in [facility]icis

## 2011-11-14 ENCOUNTER — Telehealth: Payer: Self-pay | Admitting: Internal Medicine

## 2011-11-14 NOTE — Telephone Encounter (Signed)
Monitor has been read and results being sent today to Dr. Anne Hahn

## 2011-11-14 NOTE — Telephone Encounter (Signed)
New msg Pt wants to talk you about heart monitor results

## 2011-11-14 NOTE — Telephone Encounter (Signed)
Spoke with Dr. Pandora Leiter office at hospital and practice is only in patient.  Monitor will be read today and results will be sent to Dr. Anne Hahn at [facility] today.  I called to give pt this information. Left message to call back.

## 2011-11-14 NOTE — Telephone Encounter (Signed)
Monitor was put on by our office and ordered by Dr. Mahala Menghini. It is to be read by EP doctor today and results to go to Dr. Mahala Menghini to contact pt. I called and gave this information to pt and she states she has been told by hospital that she should contact Dr. Tenny Craw' office for results. Pt does not have any follow up scheduled with Dr.Ross and hospital DC note indicates she is to follow up with Dr. Andrey Campanile (PCP) and Dr. Anne Hahn (neurology).  I told pt I would follow up on and call her back.

## 2011-11-14 NOTE — Telephone Encounter (Signed)
Spoke with Kelli Lucas and she will contact Dr. Anne Hahn' office later today or tomorrow for monitor results.

## 2013-11-27 ENCOUNTER — Encounter (HOSPITAL_COMMUNITY): Payer: Self-pay | Admitting: Emergency Medicine

## 2013-11-27 ENCOUNTER — Emergency Department (HOSPITAL_COMMUNITY)
Admission: EM | Admit: 2013-11-27 | Discharge: 2013-11-27 | Disposition: A | Discharge status: Home / Self Care | Payer: Medicare Other | Attending: Emergency Medicine | Admitting: Emergency Medicine

## 2013-11-27 ENCOUNTER — Emergency Department (HOSPITAL_COMMUNITY): Payer: Medicare Other

## 2013-11-27 DIAGNOSIS — F410 Panic disorder [episodic paroxysmal anxiety] without agoraphobia: Secondary | ICD-10-CM | POA: Insufficient documentation

## 2013-11-27 DIAGNOSIS — Z856 Personal history of leukemia: Secondary | ICD-10-CM | POA: Insufficient documentation

## 2013-11-27 DIAGNOSIS — I4891 Unspecified atrial fibrillation: Secondary | ICD-10-CM | POA: Insufficient documentation

## 2013-11-27 DIAGNOSIS — Z880 Allergy status to penicillin: Secondary | ICD-10-CM | POA: Insufficient documentation

## 2013-11-27 DIAGNOSIS — R002 Palpitations: Secondary | ICD-10-CM | POA: Insufficient documentation

## 2013-11-27 DIAGNOSIS — R5383 Other fatigue: Secondary | ICD-10-CM

## 2013-11-27 DIAGNOSIS — R110 Nausea: Secondary | ICD-10-CM | POA: Insufficient documentation

## 2013-11-27 DIAGNOSIS — I10 Essential (primary) hypertension: Secondary | ICD-10-CM | POA: Insufficient documentation

## 2013-11-27 DIAGNOSIS — R5381 Other malaise: Secondary | ICD-10-CM | POA: Insufficient documentation

## 2013-11-27 DIAGNOSIS — F419 Anxiety disorder, unspecified: Secondary | ICD-10-CM

## 2013-11-27 DIAGNOSIS — Z7982 Long term (current) use of aspirin: Secondary | ICD-10-CM | POA: Insufficient documentation

## 2013-11-27 DIAGNOSIS — Z79899 Other long term (current) drug therapy: Secondary | ICD-10-CM | POA: Insufficient documentation

## 2013-11-27 DIAGNOSIS — E785 Hyperlipidemia, unspecified: Secondary | ICD-10-CM | POA: Insufficient documentation

## 2013-11-27 LAB — CBC
HEMATOCRIT: 45 % (ref 36.0–46.0)
HEMOGLOBIN: 15.1 g/dL — AB (ref 12.0–15.0)
MCH: 30.8 pg (ref 26.0–34.0)
MCHC: 33.6 g/dL (ref 30.0–36.0)
MCV: 91.8 fL (ref 78.0–100.0)
Platelets: 236 10*3/uL (ref 150–400)
RBC: 4.9 MIL/uL (ref 3.87–5.11)
RDW: 13.6 % (ref 11.5–15.5)
WBC: 26 10*3/uL — AB (ref 4.0–10.5)

## 2013-11-27 LAB — COMPREHENSIVE METABOLIC PANEL
ALK PHOS: 95 U/L (ref 39–117)
ALT: 19 U/L (ref 0–35)
AST: 25 U/L (ref 0–37)
Albumin: 4.3 g/dL (ref 3.5–5.2)
BILIRUBIN TOTAL: 0.5 mg/dL (ref 0.3–1.2)
BUN: 12 mg/dL (ref 6–23)
CHLORIDE: 100 meq/L (ref 96–112)
CO2: 26 mEq/L (ref 19–32)
Calcium: 10 mg/dL (ref 8.4–10.5)
Creatinine, Ser: 0.77 mg/dL (ref 0.50–1.10)
GFR calc non Af Amer: 76 mL/min — ABNORMAL LOW (ref >90)
GFR, EST AFRICAN AMERICAN: 88 mL/min — AB (ref >90)
GLUCOSE: 124 mg/dL — AB (ref 70–99)
POTASSIUM: 3.6 meq/L — AB (ref 3.7–5.3)
SODIUM: 140 meq/L (ref 137–147)
TOTAL PROTEIN: 7.6 g/dL (ref 6.0–8.3)

## 2013-11-27 LAB — I-STAT TROPONIN, ED: TROPONIN I, POC: 0.01 ng/mL (ref 0.00–0.08)

## 2013-11-27 MED ORDER — LORAZEPAM 1 MG PO TABS: 0.5000 mg | ORAL_TABLET | Freq: Once | ORAL | Status: DC

## 2013-11-27 MED ORDER — ALPRAZOLAM 0.5 MG PO TABS: 0.5000 mg | ORAL_TABLET | Freq: Every evening | ORAL | Status: DC | PRN

## 2013-11-27 NOTE — ED Provider Notes (Signed)
CSN: [id number]     Arrival date & time 11/27/13  9798 History   First MD Initiated Contact with Patient 11/27/13 1052     Chief Complaint  Patient presents with  . Irregular Heart Beat     (Consider location/radiation/quality/duration/timing/severity/associated sxs/prior Treatment) HPI  Kelli Lucas is a 80 y.o.female with a significant PMH of  hypertension, leukemia- chronic lymphoid, paroxysmal a.fib, panic attacks. presents to the ER with complaints of palpitations in her chest. She reports that she has a hx of unknown arrythmia for which she takes Cardizem, HCTZ, and Toprol. She has not had problems with it within the past few years but the past 3 days she has had a lot more episodes than normal. She describes feeling a sensation in the back of her throat and feeling nauseous and as though she may pass out. She reports feeling tired with it. She did not come to the ER because she did not want to. The feeling started again this morning at breakfast and continued until she got to the ER, as soon as she got to the ER they resolved. She says she came today over other days because she finally had some "free time and nothing else to do". She also has had intermittent shooting epigastric pains. They last just a second at a time are infrequent and "get her attention".  She currently has no symptoms or complaints and no a. Fib on cardiac monitor.   Past Medical History  Diagnosis Date  . Hypertension   . Leukemia, chronic lymphoid     chronic lymphocytis Leukemia-MD Harlow Asa, Thurmon Fair had this for about about 1 yr.   Is no t on any current therapy-is being watcvhed  . HLD (hyperlipidemia)     takes lipitor-last lipid panel about 3 mo ago, and was good  . Paroxysmal atrial fibrillation     takes  asa for this-"most of her life"  . Panic attacks    Past Surgical History  Procedure Laterality Date  . Breast lumpectomy    . Tee without cardioversion  10/15/2011    Procedure: TRANSESOPHAGEAL  ECHOCARDIOGRAM (TEE);  Surgeon: Fay Records, MD;  Location: [facility] ENDOSCOPY;  Service: Cardiovascular;  Laterality: N/A;   Family History  Problem Relation Age of Onset  . Heart failure Mother     liverd till 36  . Stroke Father     LIVED TO 79 YR OF AGE  . Coronary artery disease Father     HEART ATTACK WHERN IN 80'S   History  Substance Use Topics  . Smoking status: Never Smoker   . Smokeless tobacco: Never Used  . Alcohol Use: No   OB History   Grav Para Term Preterm Abortions TAB SAB Ect Mult Living                 Review of Systems  Constitutional: Positive for fatigue.  Cardiovascular: Positive for chest pain and palpitations.  Gastrointestinal: Positive for nausea.  All other systems reviewed and are negative.     Allergies  Phenobarbital; Penicillins; and Sulfa antibiotics  Home Medications   Prior to Admission medications   Medication Sig Start Date End Date Taking? Authorizing Provider  aspirin EC 325 MG tablet Take 325 mg by mouth daily.   Yes Historical Provider, MD  atorvastatin (LIPITOR) 40 MG tablet Take 40 mg by mouth daily.   Yes Historical Provider, MD  cholecalciferol (VITAMIN D) 1000 UNITS tablet Take 1,000 Units by mouth daily.   Yes Historical Provider,  MD  diltiazem (CARDIZEM CD) 180 MG 24 hr capsule Take 180 mg by mouth daily.   Yes Historical Provider, MD  hydrochlorothiazide (HYDRODIURIL) 25 MG tablet Take 25 mg by mouth daily.   Yes Historical Provider, MD  metoprolol succinate (TOPROL-XL) 50 MG 24 hr tablet Take 50 mg by mouth daily. Take with or immediately following a meal.   Yes Historical Provider, MD   BP 125/49  Pulse 61  Temp(Src) 97.8 F (36.6 C) (Oral)  Resp 12  SpO2 98% Physical Exam  Nursing note and vitals reviewed. Constitutional: She appears well-developed and well-nourished. No distress.  HENT:  Head: Normocephalic and atraumatic.  Eyes: Pupils are equal, round, and reactive to light.  Neck: Normal range of motion.  Neck supple.  Cardiovascular: Normal rate and regular rhythm.   No chest wall or epigastric tenderness to palpation  Pulmonary/Chest: Effort normal. No respiratory distress. She has no wheezes.  Abdominal: Soft.  Neurological: She is alert.  Skin: Skin is warm and dry.    ED Course  Procedures (including critical care time) Labs Review Labs Reviewed  CBC - Abnormal; Notable for the following:    WBC 26.0 (*)    Hemoglobin 15.1 (*)    All other components within normal limits  COMPREHENSIVE METABOLIC PANEL - Abnormal; Notable for the following:    Potassium 3.6 (*)    Glucose, Bld 124 (*)    GFR calc non Af Amer 76 (*)    GFR calc Af Amer 88 (*)    All other components within normal limits  Randolm Idol, ED    Imaging Review Dg Chest 2 View  11/27/2013   CLINICAL DATA:  Irregular heart beat.  EXAM: CHEST  2 VIEW  COMPARISON:  DG CHEST 2 VIEW dated 10/12/2011  FINDINGS: The heart size and mediastinal contours are within normal limits. Both lungs are clear and mildly hyperinflated. The visualized skeletal structures are unremarkable.  IMPRESSION: Mild stable hyperinflation consistent with COPD or reactive airway disease. No acute cardiopulmonary disease.   Electronically Signed   By: David  Martinique   On: 11/27/2013 10:43     EKG Interpretation   Date/Time:  Friday Nov 27 2013 09:57:20 EDT Ventricular Rate:  74 PR Interval:  166 QRS Duration: 72 QT Interval:  402 QTC Calculation: 446 R Axis:   59 Text Interpretation:  Normal sinus rhythm Right atrial enlargement No  significant change since last tracing Confirmed by POLLINA  MD,  CHRISTOPHER [phone]) on 11/27/2013 12:00:07 PM      MDM   Final diagnoses:  Heart palpitations  Anxiety    Patient is asymptomatic in the emergency department and we have not noted any arrhythmia on her EKG. She's no longer having symptoms. If she is indeed having  paroxysmal atrial fibrillation but converted on her own and there is nothing  to be done at this time aside from her being seen by cardiology as outpatient. He does describe feeling nauseous and as though she might pass out but she has not lost consciousness during any of these episodes. Our workup here has been negative and she has not had any atrial fibrillation during her visit. She most likely would benefit from a cardiac monitor that she wears for an extended period of time to track her rhythm. Dr. Betsey Holiday, will see patient and give further recommendations. 12: 28 pm   Dr. Betsey Holiday feels that she can go home today with a cardiology follow-up for halter monitor and some Xanax for her  anxiety/panic attacks.   80 y.o.Jaidan Masso's evaluation in the Emergency Department is complete. It has been determined that no acute conditions requiring further emergency intervention are present at this time. The patient/guardian have been advised of the diagnosis and plan. We have discussed signs and symptoms that warrant return to the ED, such as changes or worsening in symptoms.  Vital signs are stable at discharge. Filed Vitals:   11/27/13 1247  BP: 125/49  Pulse: 61  Temp:   Resp: 12    Patient/guardian has voiced understanding and agreed to follow-up with the PCP or specialist.    Linus Mako, PA-C 11/27/13 1301

## 2013-11-27 NOTE — ED Provider Notes (Signed)
Medical screening examination/treatment/procedure(s) were conducted as a shared visit with non-physician practitioner(s) and myself.  I personally evaluated the patient during the encounter.  Please see associated note for evaluation and plan.    EKG Interpretation   Date/Time:  Friday Nov 27 2013 09:57:20 EDT Ventricular Rate:  74 PR Interval:  166 QRS Duration: 72 QT Interval:  402 QTC Calculation: 446 R Axis:   59 Text Interpretation:  Normal sinus rhythm Right atrial enlargement No  significant change since last tracing Confirmed by Bailen Geffre  MD,  Adamaris King [phone]) on 11/27/2013 12:00:07 PM       Orpah Greek, MD 11/27/13 1311

## 2013-11-27 NOTE — ED Notes (Signed)
Pt. Placed on the monitor. Call light given.

## 2013-11-27 NOTE — ED Provider Notes (Signed)
Patient presented to the ER with palpitations. Patient has a history of palpitations for which she has been treated with Toprol-XL she also takes Cardizem. She reports that she had recurrent palpitations to the course of the day yesterday and came in to be seen. Upon arrival to the ER, however, symptoms have resolved.  Face to face Exam: HEENT - PERRLA Lungs - CTAB Heart - RRR, no M/R/G Abd - S/NT/ND Neuro - alert, oriented x3  Plan:  Reviewing her records reveals that she does have a history of paroxysmal atrial fibrillation. She also indicates a history of PVCs. Neither were seen during monitoring here in the ER. Patient is symptom-free and her workup was unremarkable. We'll discharge him to followup with cardiology for possible outpatient event monitoring. Patient was counseled to return to the ER she has any persistent palpitations for monitoring/EKG to identify the source.  Orpah Greek, MD 11/27/13 [phone]

## 2013-11-27 NOTE — ED Provider Notes (Signed)
Medical screening examination/treatment/procedure(s) were conducted as a shared visit with non-physician practitioner(s) and myself.  I personally evaluated the patient during the encounter.  Please see associated note for evaluation and plan.    EKG Interpretation   Date/Time:  Friday Nov 27 2013 09:57:20 EDT Ventricular Rate:  74 PR Interval:  166 QRS Duration: 72 QT Interval:  402 QTC Calculation: 446 R Axis:   59 Text Interpretation:  Normal sinus rhythm Right atrial enlargement No  significant change since last tracing Confirmed by Khriz Liddy  MD,  Charmel Pronovost [phone]) on 11/27/2013 12:00:07 PM       Orpah Greek, MD 11/27/13 1312

## 2013-11-27 NOTE — Discharge Instructions (Signed)
Cardiac Event Monitoring A cardiac event monitor is a small recording device used to help detect abnormal heart rhythms (arrhythmias). The monitor is used to record heart rhythm when noticeable symptoms such as the following occur:  Fast heart beats (palpitations), such as heart racing or fluttering.  Dizziness.  Fainting or lightheadedness.  Unexplained weakness. The monitor is wired to two electrodes placed on your chest. Electrodes are flat, sticky disks that attach to your skin. The monitor can be worn for up to 30 days. You will wear the monitor at all times, except when bathing.  HOW TO USE YOUR CARDIAC EVENT MONITOR A technician will prepare your chest for the electrode placement. The technician will show you how to place the electrodes, how to work the monitor, and how to replace the batteries. Take time to practice using the monitor before you leave the office. Make sure you understand how to send the information from the monitor to your health care provider. This requires a telephone with a landline, not a cellphone. You need to:  Wear your monitor at all times, except when you are in water:  Do not get the monitor wet.  Take the monitor off when bathing. Do not swim or use a hot tub with it on.  Keep your skin clean. Do not put body lotion or moisturizer on your chest.  Change the electrodes daily or any time they stop sticking to your skin. You might need to use tape to keep them on.  It is possible that your skin under the electrodes could become irritated. To keep this from happening, try to put the electrodes in slightly different places on your chest. However, they must remain in the area under your left breast and in the upper right section of your chest.  Make sure the monitor is safely clipped to your clothing or in a location close to your body that your health care provider recommends.  Press the button to record when you feel symptoms of heart trouble, such as  dizziness, weakness, lightheadedness, palpitations, thumping, shortness of breath, unexplained weakness, or a fluttering or racing heart. The monitor is always on and records what happened slightly before you pressed the button, so do not worry about being too late to get good information.  Keep a diary of your activities, such as walking, doing chores, and taking medicine. It is especially important to note what you were doing when you pushed the button to record your symptoms. This will help your health care provider determine what might be contributing to your symptoms. The information stored in your monitor will be reviewed by your health care provider alongside your diary entries.  Send the recorded information as recommended by your health care provider. It is important to understand that it will take some time for your health care provider to process the results.  Change the batteries as recommended by your health care provider. SEEK IMMEDIATE MEDICAL CARE IF:   You have chest pain.  You have extreme difficulty breathing or shortness of breath.  You develop a very fast heartbeat that persists.  You develop dizziness that does not go away .  You faint or constantly feel you are about to faint. Document Released: 04/03/2008 Document Revised: 02/25/2013 Document Reviewed: 12/22/2012 [facility] Patient Information 2014 [place], [place].  Cardiac Arrhythmia Your heart is a muscle that works to pump blood through your body by regular contractions. The beating of your heart is controlled by a system of special pacemaker cells. These  cells control the electrical activity of the heart. When the system controlling this regular beating is disturbed, a heart rhythm abnormality (arrhythmia) results. WHEN YOUR HEART SKIPS A BEAT One of the most common and least serious heart arrhythmias is called an ectopic or premature atrial heartbeat (PAC). This may be noticed as a small change in your regular  pulse. A PAC originates from the top part (atrium) of the heart. Within the right atrium, the SA node is the area that normally controls the regularity of the heart. PACs occur in heart tissue outside of the SA node region. You may feel this as a skipped beat or heart flutter, especially if several occur in succession or occur frequently.  Another arrhythmia is ventricular premature complex (VCP or PVC). These extra beats start out in the bottom, more muscular chambers of the heart. In most cases a PVC is harmless. If there are underlying causes that are making the heart irritable such as an overactive thyroid or a prior heart attack PVCs may be of more concern. In a few cases, medications to control the heart rhythm may be prescribed. Things to try at home:  Cut down or avoid alcohol, tobacco and caffeine.  Get enough sleep.  Reduce stress.  Exercise more. WHEN THE HEART BEATS TOO FAST Atrial tachycardia is a fast heart rate, which starts out in the atrium. It may last from minutes to much longer. Your heart may beat 140 to 240 times per minute instead of the normal 60 to 100.  Symptoms include a worried feeling (anxiety) and a sense that your heart is beating fast and hard.  You may be able to stop the fast rate by holding your breath or bearing down as if you were going to have a bowel movement.  This type of fast rate is usually not dangerous. Atrial fibrillation and atrial flutter are other fast rhythms that start in the atria. Both conditions keep the atria from filling with enough blood so the heart does not work well.  Symptoms include feeling light-headed or faint.  These fast rates may be the result of heart damage or disease. Too much thyroid hormone may play a role.  There may be no clear cause or it may be from heart disease or damage.  Medication or a special electrical treatment (cardioversion) may be needed to get the heart beating normally. Ventricular tachycardia is a  fast heart rate that starts in the lower muscular chambers (ventricles) This is a serious disorder that requires treatment as soon as possible. You need someone else to get and use a small defibrillator.  Symptoms include collapse, chest pain, or being short of breath.  Treatment may include medication, procedures to improve blood flow to the heart, or an implantable cardiac defibrillator (ICD). DIAGNOSIS   A cardiogram (EKG or ECG) will be done to see the arrhythmia, as well as lab tests to check the underlying cause.  If the extra beats or fast rate come and go, you may wear a Holter monitor that records your heart rate for a longer period of time. SEEK MEDICAL CARE IF:  You have irregular or fast heartbeats (palpitations).  You experience skipped beats.  You develop lightheadedness.  You have chest discomfort.  You have shortness of breath.  You have more frequent episodes, if you are already being treated. SEEK IMMEDIATE MEDICAL CARE IF:   You have severe chest pain, especially if the pain is crushing or pressure-like and spreads to the arms, back, neck,  or jaw, or if you have sweating, feeling sick to your stomach (nausea), or shortness of breath. THIS IS AN EMERGENCY. Do not wait to see if the pain will go away. Get medical help at once. Call 911 or 0 (operator). DO NOT drive yourself to the hospital.  You feel dizzy or faint.  You have episodes of previously documented atrial tachycardia that do not resolve with the techniques your caregiver has taught you.  Irregular or rapid heartbeats begin to occur more often than in the past, especially if they are associated with more pronounced symptoms or of longer duration. Document Released: 06/25/2005 Document Revised: 09/17/2011 Document Reviewed: 02/11/2008 [facility] Patient Information 2014 [place].

## 2013-11-27 NOTE — ED Notes (Signed)
Pt. Reports having constant palpitations all day yesterday and this am.  She feels very tired.  She denies any chest pain,  Pt. Reports having a hx of irregular HB, Pt. Is in NAD, Skin is p/w/d.  Pt. Reports feeling anxious. Having intermittent nausea, denies any vomiting, is having dry mouth and sweaty palms.  Denies any sob

## 2014-02-12 ENCOUNTER — Ambulatory Visit (INDEPENDENT_AMBULATORY_CARE_PROVIDER_SITE_OTHER): Payer: Medicare Other | Admitting: Cardiovascular Disease

## 2014-02-12 ENCOUNTER — Encounter: Payer: Self-pay | Admitting: Cardiovascular Disease

## 2014-02-12 VITALS — BP 140/70 | HR 67 | Wt 122.0 lb

## 2014-02-12 DIAGNOSIS — I10 Essential (primary) hypertension: Secondary | ICD-10-CM

## 2014-02-12 DIAGNOSIS — E785 Hyperlipidemia, unspecified: Secondary | ICD-10-CM

## 2014-02-12 DIAGNOSIS — R002 Palpitations: Secondary | ICD-10-CM

## 2014-02-12 NOTE — Patient Instructions (Signed)
Your physician wants you to follow-up in: 1 YEAR with Dr Cooper.  You will receive a reminder letter in the mail two months in advance. If you don't receive a letter, please call our office to schedule the follow-up appointment.  Your physician recommends that you continue on your current medications as directed. Please refer to the Current Medication list given to you today.  

## 2014-02-13 ENCOUNTER — Encounter: Payer: Self-pay | Admitting: Cardiovascular Disease

## 2014-02-13 NOTE — Progress Notes (Signed)
HPI:   80 year old woman presenting for initial cardiac evaluation. The patient has a long-standing history of heart palpitations. She has been followed by Dr. Redmond Pulling. Palpitations date back to childhood. They have primarily been stress related. She's had PVCs documented in the past. There has been some question raised about paroxysms of atrial fibrillation, but this has never been documented. She had a significant episode of palpitations in may of this year, prompting emergency room evaluation. She states that as soon as she was hooked up to the heart monitor, her palpitations stopped. Nothing was documented on telemetry based on review of emergency room records. She was monitored for a brief period of time and then discharged home. She's had no further symptomatic palpitations or arrhythmia since that time. She presents today because she wanted to establish with cardiology in case she has problems in the future.  The patient has been on beta blockers long-term. For many years she took propranolol. She tolerated this well, but was changed to long-acting metoprolol approximately 8 years ago. She also takes diltiazem. She has never been anticoagulated. The patient exercises sporadically with a walking program. She denies symptoms with physical exertion. She specifically denies chest pain or pressure, dyspnea, lightheadedness, or presyncope. She has no edema, orthopnea, or PND.  In 2013 she was hospitalized with symptoms of a stroke. An MRI of the brain demonstrated an acute infarct in the left posterior frontal lobe. There was no clear etiology discovered. A transesophageal echocardiogram demonstrated normal left ventricular function and no valvular disease. There was no PFO or ASD. She was noted to have left ventricular hypertrophy. An event monitor following hospital admission demonstrated sinus rhythm with PVCs but no evidence of atrial fibrillation.  Outpatient Encounter Prescriptions as of 02/12/2014    Medication Sig  . ALPRAZolam (XANAX) 0.5 MG tablet Take 1 tablet (0.5 mg total) by mouth at bedtime as needed for anxiety.  Marland Kitchen aspirin EC 325 MG tablet Take 325 mg by mouth daily.  Marland Kitchen atorvastatin (LIPITOR) 40 MG tablet Take 40 mg by mouth daily.  . cholecalciferol (VITAMIN D) 1000 UNITS tablet Take 1,000 Units by mouth daily.  Marland Kitchen diltiazem (CARDIZEM CD) 180 MG 24 hr capsule Take 180 mg by mouth daily.  . hydrochlorothiazide (HYDRODIURIL) 25 MG tablet 1/2 TAB PO QD  . metoprolol succinate (TOPROL-XL) 50 MG 24 hr tablet Take 50 mg by mouth daily. Take with or immediately following a meal.    Phenobarbital; Penicillins; and Sulfa antibiotics  Past Medical History  Diagnosis Date  . Hypertension   . Leukemia, chronic lymphoid     chronic lymphocytis Leukemia-MD Harlow Asa, Thurmon Fair had this for about about 1 yr.   Is no t on any current therapy-is being watcvhed  . HLD (hyperlipidemia)     takes lipitor-last lipid panel about 3 mo ago, and was good  . Paroxysmal atrial fibrillation     takes  asa for this-"most of her life"  . Panic attacks     Past Surgical History  Procedure Laterality Date  . Breast lumpectomy    . Tee without cardioversion  10/15/2011    Procedure: TRANSESOPHAGEAL ECHOCARDIOGRAM (TEE);  Surgeon: Fay Records, MD;  Location: [facility] ENDOSCOPY;  Service: Cardiovascular;  Laterality: N/A;    History   Social History  . Marital Status: Married    Spouse Name: N/A    Number of Children: N/A  . Years of Education: N/A   Occupational History  . Not on file.   Social History  Main Topics  . Smoking status: Never Smoker   . Smokeless tobacco: Never Used  . Alcohol Use: No  . Drug Use: No  . Sexual Activity:    Other Topics Concern  . Not on file   Social History Narrative   Worked with [employer]-workwed there and then married   Network engineer, office work-    Family History  Problem Relation Age of Onset  . Heart failure Mother     liverd till 29  . Stroke Father      LIVED TO 27 YR OF AGE  . Coronary artery disease Father     HEART ATTACK WHERN IN 80'S    ROS:  General: no fevers/chills/night sweats Eyes: no blurry vision, diplopia, or amaurosis ENT: no sore throat or hearing loss Resp: no cough, wheezing, or hemoptysis CV: See history of present illness GI: no abdominal pain, nausea, vomiting, diarrhea, or constipation GU: no dysuria, frequency, or hematuria Skin: no rash Neuro: no headache, numbness, tingling, or weakness of extremities Musculoskeletal: no joint pain or swelling Heme: no bleeding, DVT, or easy bruising Endo: no polydipsia or polyuria  BP 140/70  Pulse 67  Wt 122 lb (55.339 kg)  PHYSICAL EXAM: Pt is alert and oriented, WD, WN, pleasant elderly woman in no distress. HEENT: normal Neck: JVP normal. Carotid upstrokes normal without bruits. No thyromegaly. Lungs: equal expansion, clear bilaterally CV: Apex is discrete and nondisplaced, RRR without murmur or gallop Abd: soft, NT, +BS, no bruit, no hepatosplenomegaly Back: no CVA tenderness Ext: no C/C/E        DP/PT pulses intact and = Skin: warm and dry without rash Neuro: CNII-XII intact             Strength intact = bilaterally  EKG:  Normal sinus rhythm, minor ST abnormality, nonspecific  ASSESSMENT AND PLAN: 1. Heart palpitations. The patient has had episodic symptoms, oftentimes related to stress or anxiety. She seems to be well-controlled currently on a combination of metoprolol succinate and diltiazem. We discussed consideration of changing her beta blocker to Inderal which she tolerated well for many years. We decided to continue her current program now unless she develops more problems. I carefully reviewed her records, including her emergency room records from may and her event monitor as well as hospital records from 2013. There is no documented evidence of atrial fibrillation, thus no indication for anticoagulation. She will remain on aspirin.  2.  Hyperlipidemia. Managed by Dr. Redmond Pulling. The patient is on atorvastatin.  3. Essential hypertension. Blood pressure appears to be well-controlled on a combination of diltiazem, hydrochlorothiazide, and metoprolol succinate.  4. History of stroke. She is on a good risk reduction program with a statin drug for treatment of her hyperlipidemia, well-controlled hypertension, and antiplatelet therapy with aspirin.  For followup I will plan on seeing her yearly.  Sherren Mocha 02/13/2014 7:27 AM

## 2015-05-12 NOTE — Progress Notes (Signed)
Cardiology Office Note Date:  05/15/2015   ID:  Kelli Lucas, DOB 1930-06-30, MRN [id number]  PCP:  Woody Seller, MD  Cardiologist:  Sherren Mocha, MD    Chief Complaint  Patient presents with  . Palpitations    History of Present Illness: Kelli Lucas is a 81 y.o. female who presents for follow-up evaluation.   She was initially seen one year for palpitations. Symptoms have been longstanding, dating back to her childhood. She is treated with diltiazem and metoprolol succinate. She had a stroke in 2013. A TEE was negative and an event monitor showed no atrial fibrillation. She is treated with low-dose aspirin.  The patient is doing well. She reports no interval change in symptoms over the past year. She continues to have palpitations related to stress. No sustained heart palpitations or perception of her heart racing. No chest pain, shortness of breath, edema, orthopnea, PND, or lightheadedness.  Past Medical History  Diagnosis Date  . Hypertension   . Leukemia, chronic lymphoid ([place])     chronic lymphocytis Leukemia-MD Harlow Asa, Thurmon Fair had this for about about 1 yr.   Is no t on any current therapy-is being watcvhed  . HLD (hyperlipidemia)     takes lipitor-last lipid panel about 3 mo ago, and was good  . Panic attacks   . Heart palpitations      lifelong symptoms    Past Surgical History  Procedure Laterality Date  . Breast lumpectomy    . Tee without cardioversion  10/15/2011    Procedure: TRANSESOPHAGEAL ECHOCARDIOGRAM (TEE);  Surgeon: Fay Records, MD;  Location: [facility] ENDOSCOPY;  Service: Cardiovascular;  Laterality: N/A;    Current Outpatient Prescriptions  Medication Sig Dispense Refill  . aspirin EC 325 MG tablet Take 325 mg by mouth daily.    Marland Kitchen atorvastatin (LIPITOR) 40 MG tablet Take 40 mg by mouth daily.    . Calcium-Magnesium-Vitamin D (CALCIUM 500 PO) Take 500 mg by mouth daily.    . cholecalciferol (VITAMIN D) 1000 UNITS tablet Take 1,000 Units by mouth  daily.    Marland Kitchen diltiazem (CARDIZEM CD) 180 MG 24 hr capsule Take 180 mg by mouth daily.    . hydrochlorothiazide (HYDRODIURIL) 25 MG tablet Take 12.5 mg by mouth daily.     . metoprolol succinate (TOPROL-XL) 50 MG 24 hr tablet Take 50 mg by mouth daily. Take with or immediately following a meal.     No current facility-administered medications for this visit.   Allergies:   Phenobarbital; Penicillins; and Sulfa antibiotics   Social History:  The patient  reports that she has never smoked. She has never used smokeless tobacco. She reports that she does not drink alcohol or use illicit drugs.   Family History:  The patient's  family history includes Coronary artery disease in her father; Heart failure in her mother; Stroke in her father.   ROS:  Please see the history of present illness.  All other systems are reviewed and negative.   PHYSICAL EXAM: VS:  BP 130/70 mmHg  Pulse 65  Ht 4\' 11"  (1.499 m)  Wt 123 lb 12.8 oz (56.155 kg)  BMI 24.99 kg/m2 , BMI Body mass index is 24.99 kg/(m^2). GEN: Well nourished, well developed, pleasant elderly woman in no acute distress HEENT: normal Neck: no JVD, no masses. No carotid bruits Cardiac: RRR without murmur or gallop                Respiratory:  clear to auscultation bilaterally, normal work of  breathing GI: soft, nontender, nondistended, + BS MS: no deformity or atrophy Ext: no pretibial edema, pedal pulses 2+= bilaterally Skin: warm and dry, no rash Neuro:  Strength and sensation are intact Psych: euthymic mood, full affect  EKG:  EKG is ordered today. The ekg ordered today shows NSR 65 bpm, nonspecific ST abnormality  Recent Labs: No results found for requested labs within last 365 days.   Lipid Panel     Component Value Date/Time   CHOL 154 10/13/2011 0500   TRIG 105 10/13/2011 0500   HDL 47 10/13/2011 0500   CHOLHDL 3.3 10/13/2011 0500   VLDL 21 10/13/2011 0500   LDLCALC 86 10/13/2011 0500      Wt Readings from Last 3  Encounters:  05/13/15 123 lb 12.8 oz (56.155 kg)  02/12/14 122 lb (55.339 kg)  10/13/11 125 lb (56.7 kg)     ASSESSMENT AND PLAN: 1.  Palpitations: Stable pattern with no recent change. EKG normal today.  2. Hyperlipidemia: Managed with atorvastatin. Labs followed by PCP.  3. Essential HTN: Blood pressure is well controlled on Cardizem CD and metoprolol succinate  4. Hx of stroke: History carefully reviewed. No stroke/TIA symptoms.  Current medicines are reviewed with the patient today.  The patient does not have concerns regarding medicines.  Labs/ tests ordered today include:   Orders Placed This Encounter  Procedures  . EKG 12-Lead    Disposition:   FU one year  Signed, Sherren Mocha, MD  05/15/2015 9:18 PM    [facility] [address], [place], [place]  [id number] Phone: [phone]; Fax: [phone]

## 2015-05-13 ENCOUNTER — Encounter: Payer: Self-pay | Admitting: Cardiovascular Disease

## 2015-05-13 ENCOUNTER — Ambulatory Visit (INDEPENDENT_AMBULATORY_CARE_PROVIDER_SITE_OTHER): Payer: Medicare Other | Admitting: Cardiovascular Disease

## 2015-05-13 VITALS — BP 130/70 | HR 65 | Ht 59.0 in | Wt 123.8 lb

## 2015-05-13 DIAGNOSIS — E785 Hyperlipidemia, unspecified: Secondary | ICD-10-CM

## 2015-05-13 DIAGNOSIS — I480 Paroxysmal atrial fibrillation: Secondary | ICD-10-CM | POA: Diagnosis not present

## 2015-05-13 DIAGNOSIS — I10 Essential (primary) hypertension: Secondary | ICD-10-CM

## 2015-05-13 DIAGNOSIS — R002 Palpitations: Secondary | ICD-10-CM | POA: Diagnosis not present

## 2015-05-13 NOTE — Patient Instructions (Signed)

## 2018-06-20 ENCOUNTER — Other Ambulatory Visit: Payer: Self-pay | Admitting: Orthopedic Surgery

## 2018-06-20 DIAGNOSIS — M545 Low back pain, unspecified: Secondary | ICD-10-CM

## 2018-06-20 DIAGNOSIS — M5442 Lumbago with sciatica, left side: Secondary | ICD-10-CM

## 2018-06-20 DIAGNOSIS — M5441 Lumbago with sciatica, right side: Secondary | ICD-10-CM

## 2018-06-28 ENCOUNTER — Ambulatory Visit
Admission: RE | Admit: 2018-06-28 | Discharge: 2018-06-28 | Disposition: A | Discharge status: Home / Self Care | Payer: Medicare Other | Source: Ambulatory Visit | Attending: Orthopedic Surgery | Admitting: Orthopedic Surgery

## 2018-06-28 DIAGNOSIS — M5442 Lumbago with sciatica, left side: Secondary | ICD-10-CM

## 2018-06-28 DIAGNOSIS — M545 Low back pain, unspecified: Secondary | ICD-10-CM

## 2018-06-28 DIAGNOSIS — M5441 Lumbago with sciatica, right side: Secondary | ICD-10-CM

## 2018-08-13 ENCOUNTER — Telehealth: Payer: Self-pay | Admitting: *Deleted

## 2018-08-13 NOTE — Telephone Encounter (Signed)
   [facility] Pre-operative Risk Assessment    Request for surgical clearance:  1. What type of surgery is being performed? RIGHT KNEE ARTHROSCOPY   2. When is this surgery scheduled? TBD   3. What type of clearance is required (medical clearance vs. Pharmacy clearance to hold med vs. Both)? MEDICAL  4. Are there any medications that need to be held prior to surgery and how long?ASA    5. Practice name and name of physician performing surgery? [facility]; DR. Jenny Reichmann [place]   6. What is your office phone number [phone]    7.   What is your office fax number [phone]  8.   Anesthesia type (None, local, MAC, general) ? GENERAL   Kelli Lucas 08/13/2018, 9:45 AM  _________________________________________________________________   (provider comments below)

## 2018-08-15 NOTE — Telephone Encounter (Signed)
Patient is scheduled to see Dr. Gwenlyn Found on 08-19-2018

## 2018-08-15 NOTE — Telephone Encounter (Signed)
   Primary Cardiologist: Dr. Burt Knack, but hasn't been seen since 2016  Chart reviewed as part of pre-operative protocol coverage. Because of Kelli Lucas's past medical history and time since last visit, he/she will require a follow-up visit in order to better assess preoperative cardiovascular risk.  Pre-op covering staff: - Please schedule appointment and call patient to inform them. - Please contact requesting surgeon's office via preferred method (i.e, phone, fax) to inform them of need for appointment prior to surgery.  If applicable, this message will also be routed to pharmacy pool and/or primary cardiologist for input on holding anticoagulant/antiplatelet agent as requested below so that this information is available at time of patient's appointment.   [place], PA  08/15/2018, 2:08 PM

## 2018-08-15 NOTE — Telephone Encounter (Signed)
1st attempt- Called patient and left a voice message explaining that I was calling to get her scheduled for an appointment for surgical clearance.

## 2018-08-19 ENCOUNTER — Ambulatory Visit (INDEPENDENT_AMBULATORY_CARE_PROVIDER_SITE_OTHER): Payer: Medicare Other | Admitting: Cardiovascular Disease

## 2018-08-19 ENCOUNTER — Encounter: Payer: Self-pay | Admitting: Cardiovascular Disease

## 2018-08-19 DIAGNOSIS — Z01818 Encounter for other preprocedural examination: Secondary | ICD-10-CM | POA: Diagnosis not present

## 2018-08-19 DIAGNOSIS — E782 Mixed hyperlipidemia: Secondary | ICD-10-CM | POA: Diagnosis not present

## 2018-08-19 DIAGNOSIS — I10 Essential (primary) hypertension: Secondary | ICD-10-CM | POA: Diagnosis not present

## 2018-08-19 NOTE — Assessment & Plan Note (Signed)
History of hyperlipidemia on statin therapy followed by her PCP. 

## 2018-08-19 NOTE — Progress Notes (Signed)
08/19/2018 Kelli Lucas   07-26-1929  [id number]  Primary Physician Christain Sacramento, MD Primary Cardiologist: Lorretta Harp MD [name], [place]  HPI:  Kelli Lucas is a 85 y.o. married Caucasian female mother of 2, grandmother of 4 grandchildren referred by Dr. Redmond Pulling for preoperative clearance before elective outpatient right torn meniscus repair persist by Dr. Berenice Primas.  She was previously a patient Dr. Antionette Char.  She retired from working at [employer] in [place].  Husband is a patient of Dr. Antionette Char.  She was last seen by Dr. Burt Knack 05/15/2015.  She does have a history of treated hypertension hyperlipidemia.  She was having palpitations back then and is been on a beta-blocker and calcium channel channel blocker improve these.  She did have a stroke April 2013 without neurologic deficits.  Her father had a MI at age 12 although she has never had a heart attack.  She denies chest pain or shortness of breath.  She does have CLL which is chronic and has not been treated.   Current Meds  Medication Sig  . aspirin EC 325 MG tablet Take 325 mg by mouth daily.  Marland Kitchen atorvastatin (LIPITOR) 40 MG tablet Take 40 mg by mouth daily.  . Calcium-Magnesium-Vitamin D (CALCIUM 500 PO) Take 500 mg by mouth daily.  . cholecalciferol (VITAMIN D) 1000 UNITS tablet Take 1,000 Units by mouth daily.  Marland Kitchen diltiazem (CARDIZEM CD) 180 MG 24 hr capsule Take 180 mg by mouth daily.  . hydrochlorothiazide (HYDRODIURIL) 25 MG tablet Take 12.5 mg by mouth daily.   . metoprolol succinate (TOPROL-XL) 50 MG 24 hr tablet Take 50 mg by mouth daily. Take with or immediately following a meal.     Allergies  Allergen Reactions  . Phenobarbital Nausea And Vomiting  . Penicillins Rash  . Sulfa Antibiotics Rash    Social History   Socioeconomic History  . Marital status: Married    Spouse name: Not on file  . Number of children: Not on file  . Years of education: Not on file  . Highest  education level: Not on file  Occupational History  . Not on file  Social Needs  . Financial resource strain: Not on file  . Food insecurity:    Worry: Not on file    Inability: Not on file  . Transportation needs:    Medical: Not on file    Non-medical: Not on file  Tobacco Use  . Smoking status: Never Smoker  . Smokeless tobacco: Never Used  Substance and Sexual Activity  . Alcohol use: No  . Drug use: No  . Sexual activity: Not on file  Lifestyle  . Physical activity:    Days per week: Not on file    Minutes per session: Not on file  . Stress: Not on file  Relationships  . Social connections:    Talks on phone: Not on file    Gets together: Not on file    Attends religious service: Not on file    Active member of club or organization: Not on file    Attends meetings of clubs or organizations: Not on file    Relationship status: Not on file  . Intimate partner violence:    Fear of current or ex partner: Not on file    Emotionally abused: Not on file    Physically abused: Not on file    Forced sexual activity: Not on file  Other Topics Concern  . Not  on file  Social History Narrative   Worked with the [employer]-workwed there and then married   Network engineer, office work-     Review of Systems: General: negative for chills, fever, night sweats or weight changes.  Cardiovascular: negative for chest pain, dyspnea on exertion, edema, orthopnea, palpitations, paroxysmal nocturnal dyspnea or shortness of breath Dermatological: negative for rash Respiratory: negative for cough or wheezing Urologic: negative for hematuria Abdominal: negative for nausea, vomiting, diarrhea, bright red blood per rectum, melena, or hematemesis Neurologic: negative for visual changes, syncope, or dizziness All other systems reviewed and are otherwise negative except as noted above.    Blood pressure (!) 148/67, pulse 65, height 5' (1.524 m), weight 119 lb (54 kg).  General appearance: alert and no  distress Neck: no adenopathy, no carotid bruit, no JVD, supple, symmetrical, trachea midline and thyroid not enlarged, symmetric, no tenderness/mass/nodules Lungs: clear to auscultation bilaterally Heart: regular rate and rhythm, S1, S2 normal, no murmur, click, rub or gallop Extremities: extremities normal, atraumatic, no cyanosis or edema Pulses: 2+ and symmetric Skin: Skin color, texture, turgor normal. No rashes or lesions Neurologic: Alert and oriented X 3, normal strength and tone. Normal symmetric reflexes. Normal coordination and gait  EKG sinus rhythm at 65 without ST or T wave changes. I Personally reviewed this EKG.  ASSESSMENT AND PLAN:   Hypertension History of essential hypertension her blood pressure measured today 148/67.  She is on diltiazem, metoprolol and hydrochlorothiazide.  Preoperative clearance Ms. Lacross was referred by Dr. Redmond Pulling for preoperative clearance before right torn meniscus repair by Dr. Berenice Primas.  She has minimal risk factors and no symptoms.  This is an outpatient procedure.  I am clearing her low risk without the need for a functional study.  HLD (hyperlipidemia) History of hyperlipidemia on statin therapy followed by her PCP      Lorretta Harp MD [facility], [facility] 08/19/2018 4:37 PM

## 2018-08-19 NOTE — Assessment & Plan Note (Signed)
History of essential hypertension her blood pressure measured today 148/67.  She is on diltiazem, metoprolol and hydrochlorothiazide.

## 2018-08-19 NOTE — Patient Instructions (Signed)
Medication Instructions:  Your physician recommends that you continue on your current medications as directed. Please refer to the Current Medication list given to you today.  If you need a refill on your cardiac medications before your next appointment, please call your pharmacy.   Lab work: Please have your PCP's office collect the following lab work: [place] If you have labs (blood work) drawn today and your tests are completely normal, you will receive your results only by: Marland Kitchen MyChart Message (if you have MyChart) OR . A paper copy in the mail If you have any lab test that is abnormal or we need to change your treatment, we will call you to review the results.  Testing/Procedures: NONE  Follow-Up: At [facility], you and your health needs are our priority.  As part of our continuing mission to provide you with exceptional heart care, we have created designated Provider Care Teams.  These Care Teams include your primary Cardiologist (physician) and Advanced Practice Providers (APPs -  Physician Assistants and Nurse Practitioners) who all work together to provide you with the care you need, when you need it. . You will need a follow up appointment in 12 months.  Please call our office 2 months in advance to schedule this appointment.  You may see Dr. Gwenlyn Found or one of the following Advanced Practice Providers on your designated Care Team:   . Kerin Ransom, [place] . Almyra Deforest, PA-C . Fabian Sharp, PA-C . Jory Sims, DNP . Rosaria Ferries, PA-C . Roby Lofts, PA-C . Sande Rives, PA-C  Any Other Special Instructions Will Be Listed Below (If Applicable). YOU HAVE BEEN CLEARED AT LOW RISK FROM A CARDIAC STANDPOINT FOR YOUR UPCOMING PROCEDURE.

## 2018-08-19 NOTE — Assessment & Plan Note (Signed)
Kelli Lucas was referred by Dr. Redmond Pulling for preoperative clearance before right torn meniscus repair by Dr. Berenice Primas.  She has minimal risk factors and no symptoms.  This is an outpatient procedure.  I am clearing her low risk without the need for a functional study.

## 2019-05-15 ENCOUNTER — Ambulatory Visit (INDEPENDENT_AMBULATORY_CARE_PROVIDER_SITE_OTHER): Payer: Medicare Other | Admitting: Cardiovascular Disease

## 2019-05-15 ENCOUNTER — Encounter: Payer: Self-pay | Admitting: Cardiovascular Disease

## 2019-05-15 ENCOUNTER — Other Ambulatory Visit: Payer: Self-pay

## 2019-05-15 VITALS — BP 126/66 | HR 66 | Ht 59.0 in | Wt 114.8 lb

## 2019-05-15 DIAGNOSIS — R002 Palpitations: Secondary | ICD-10-CM

## 2019-05-15 DIAGNOSIS — E782 Mixed hyperlipidemia: Secondary | ICD-10-CM

## 2019-05-15 DIAGNOSIS — I10 Essential (primary) hypertension: Secondary | ICD-10-CM

## 2019-05-15 NOTE — Progress Notes (Signed)
Cardiology Office Note:    Date:  05/16/2019   ID:  Kelli Lucas, DOB 09/22/1929, MRN [id number]  PCP:  Christain Sacramento, MD  Cardiologist:  Sherren Mocha, MD  Electrophysiologist:  None   Referring MD: Christain Sacramento, MD   Chief Complaint  Patient presents with  . Palpitations    History of Present Illness:    Kelli Lucas is a 85 y.o. female with a hx of heart palpitations, presenting for follow-up evaluation.  Patient has longstanding heart palpitations without any specific diagnosis of arrhythmia.  She has a history of stroke in 2013.  At that time a TEE was performed with no pertinent findings.  An event monitor showed no evidence of atrial fibrillation.  The patient is doing well and denies symptoms of chest pain, chest pressure, edema, orthopnea, or PND.  She continues to have heart palpitations, more pronounced at night.  She otherwise denies any intercurrent symptoms.  Past Medical History:  Diagnosis Date  . Heart palpitations     lifelong symptoms  . HLD (hyperlipidemia)    takes lipitor-last lipid panel about 3 mo ago, and was good  . Hypertension   . 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   Is no t on any current therapy-is being watcvhed  . Panic attacks     Past Surgical History:  Procedure Laterality Date  . BREAST LUMPECTOMY    . TEE WITHOUT CARDIOVERSION  10/15/2011   Procedure: TRANSESOPHAGEAL ECHOCARDIOGRAM (TEE);  Surgeon: Fay Records, MD;  Location: [facility] ENDOSCOPY;  Service: Cardiovascular;  Laterality: N/A;    Current Medications: Current Meds  Medication Sig  . aspirin EC 325 MG tablet Take 325 mg by mouth daily.  Marland Kitchen atorvastatin (LIPITOR) 40 MG tablet Take 40 mg by mouth daily.  . Calcium-Magnesium-Vitamin D (CALCIUM 500 PO) Take 500 mg by mouth daily.  . cholecalciferol (VITAMIN D) 1000 UNITS tablet Take 1,000 Units by mouth daily.  . Cyanocobalamin (VITAMIN B 12 PO) Take  5,000 mcg by mouth daily.  Marland Kitchen diltiazem (CARDIZEM CD) 180 MG 24 hr capsule Take 180 mg by mouth daily.  . hydrochlorothiazide (HYDRODIURIL) 25 MG tablet Take 12.5 mg by mouth daily.   . metoprolol succinate (TOPROL-XL) 50 MG 24 hr tablet Take 50 mg by mouth daily. Take with or immediately following a meal.     Allergies:   Phenobarbital, Penicillins, and Sulfa antibiotics   Social History   Socioeconomic History  . Marital status: Married    Spouse name: Not on file  . Number of children: Not on file  . Years of education: Not on file  . Highest education level: Not on file  Occupational History  . Not on file  Social Needs  . Financial resource strain: Not on file  . Food insecurity    Worry: Not on file    Inability: Not on file  . Transportation needs    Medical: Not on file    Non-medical: Not on file  Tobacco Use  . Smoking status: Never Smoker  . Smokeless tobacco: Never Used  Substance and Sexual Activity  . Alcohol use: No  . Drug use: No  . Sexual activity: Not on file  Lifestyle  . Physical activity    Days per week: Not on file    Minutes per session: Not on file  . Stress: Not on file  Relationships  . Social connections  Talks on phone: Not on file    Gets together: Not on file    Attends religious service: Not on file    Active member of club or organization: Not on file    Attends meetings of clubs or organizations: Not on file    Relationship status: Not on file  Other Topics Concern  . Not on file  Social History Narrative   Worked with the [employer]-workwed there and then married   Network engineer, office work-     Family History: The patient's family history includes Coronary artery disease in her father; Heart failure in her mother; Stroke in her father.  ROS:   Please see the history of present illness.    All other systems reviewed and are negative.  EKGs/Labs/Other Studies Reviewed:    EKG:  EKG is ordered today.  The ekg ordered today  demonstrates normal sinus rhythm 65 bpm.  Nonspecific ST abnormality.  Recent Labs: No results found for requested labs within last 8760 hours.  Recent Lipid Panel    Component Value Date/Time   CHOL 154 10/13/2011 0500   TRIG 105 10/13/2011 0500   HDL 47 10/13/2011 0500   CHOLHDL 3.3 10/13/2011 0500   VLDL 21 10/13/2011 0500   LDLCALC 86 10/13/2011 0500    Physical Exam:    VS:  BP 126/66   Pulse 66   Ht 4\' 11"  (1.499 m)   Wt 114 lb 12.8 oz (52.1 kg)   SpO2 98%   BMI 23.19 kg/m     Wt Readings from Last 3 Encounters:  05/15/19 114 lb 12.8 oz (52.1 kg)  08/19/18 119 lb (54 kg)  05/13/15 123 lb 12.8 oz (56.2 kg)     GEN:  Well nourished, well developed elderly woman, well-appearing, in no acute distress HEENT: Normal NECK: No JVD; No carotid bruits LYMPHATICS: No lymphadenopathy CARDIAC: RRR, no murmurs, rubs, gallops RESPIRATORY:  Clear to auscultation without rales, wheezing or rhonchi  ABDOMEN: Soft, non-tender, non-distended MUSCULOSKELETAL:  No edema; No deformity  SKIN: Warm and dry NEUROLOGIC:  Alert and oriented x 3 PSYCHIATRIC:  Normal affect   ASSESSMENT:    1. Palpitations   2. Essential hypertension   3. Mixed hyperlipidemia    PLAN:    In order of problems listed above:  1. The patient has occasional breakthrough palpitations and is advised that she can take an extra one half of metoprolol succinate on an as-needed basis.  Overall pattern is essentially stable. 2. Blood pressure is under excellent control on diltiazem, hydrochlorothiazide, and metoprolol succinate. 3. Treated with atorvastatin.  Labs are followed regularly by her PCP.  Medication Adjustments/Labs and Tests Ordered: Current medicines are reviewed at length with the patient today.  Concerns regarding medicines are outlined above.  Orders Placed This Encounter  Procedures  . EKG 12-Lead   No orders of the defined types were placed in this encounter.   Patient Instructions   Medication Instructions:  1) You may take an additional Toprol 25 mg (half a tablet) once daily as needed if your palpitations are bad *If you need a refill on your cardiac medications before your next appointment, please call your pharmacy*  Follow-Up: At [facility], you and your health needs are our priority.  As part of our continuing mission to provide you with exceptional heart care, we have created designated Provider Care Teams.  These Care Teams include your primary Cardiologist (physician) and Advanced Practice Providers (APPs -  Physician Assistants and Nurse Practitioners) who  all work together to provide you with the care you need, when you need it. Your next appointment:   12 months The format for your next appointment:   In Person Provider:   You may see Sherren Mocha, MD or one of the following Advanced Practice Providers on your designated Care Team:    Richardson Dopp, PA-C  Vin [place], PA-C  Daune Perch, [place]     Signed, Sherren Mocha, MD  05/16/2019 8:39 AM    [place]

## 2019-05-15 NOTE — Patient Instructions (Signed)
Medication Instructions:  1) You may take an additional Toprol 25 mg (half a tablet) once daily as needed if your palpitations are bad *If you need a refill on your cardiac medications before your next appointment, please call your pharmacy*  Follow-Up: At [facility], you and your health needs are our priority.  As part of our continuing mission to provide you with exceptional heart care, we have created designated Provider Care Teams.  These Care Teams include your primary Cardiologist (physician) and Advanced Practice Providers (APPs -  Physician Assistants and Nurse Practitioners) who all work together to provide you with the care you need, when you need it. Your next appointment:   12 months The format for your next appointment:   In Person Provider:   You may see Sherren Mocha, MD or one of the following Advanced Practice Providers on your designated Care Team:    Richardson Dopp, PA-C  Vin [place], [place]  Daune Perch, [place]

## 2020-02-26 ENCOUNTER — Telehealth: Payer: Self-pay | Admitting: *Deleted

## 2020-02-26 NOTE — Telephone Encounter (Signed)
   [facility] Pre-operative Risk Assessment    HEARTCARE STAFF: - Please ensure there is not already an duplicate clearance open for this procedure. - Under Visit Info/Reason for Call, type in Other and utilize the format Clearance MM/DD/YY or Clearance TBD. Do not use dashes or single digits. - If request is for dental extraction, please clarify the # of teeth to be extracted.  Request for surgical clearance:  1. What type of surgery is being performed? LEFT TOTAL HIP ARTHROPLASTY   2. When is this surgery scheduled? 03/30/20   3. What type of clearance is required (medical clearance vs. Pharmacy clearance to hold med vs. Both)? MEDICAL  4. Are there any medications that need to be held prior to surgery and how long? ASA    5. Practice name and name of physician performing surgery? [facility]; DR. FRANK ALUISIO   6. What is the office phone number? [phone]   7.   What is the office fax number? [phone] ATTN: [place]  8.   Anesthesia type (None, local, MAC, general) ? CHOICE   Julaine Hua 02/26/2020, 12:35 PM  _________________________________________________________________   (provider comments below)

## 2020-02-29 NOTE — Telephone Encounter (Signed)
Attempted to reach patient to discuss the need for an appointment prior to clearing her for her upcoming scheduled surgery. No answer, left msg for patient to call back and schedule.

## 2020-02-29 NOTE — Telephone Encounter (Signed)
   Primary [place], MD  Chart reviewed as part of pre-operative protocol coverage. Because of Kelli Lucas's past medical history and time since last visit, they will require a follow-up visit in order to better assess preoperative cardiovascular risk.  Pre-op covering staff: - Please schedule appointment and call patient to inform them. If patient already had an upcoming appointment within acceptable timeframe, please add "pre-op clearance" to the appointment notes so provider is aware. - Please contact requesting surgeon's office via preferred method (i.e, phone, fax) to inform them of need for appointment prior to surgery.  If applicable, this message will also be routed to pharmacy pool and/or primary cardiologist for input on holding anticoagulant/antiplatelet agent as requested below so that this information is available to the clearing provider at time of patient's appointment.   [place], [place]  02/29/2020, 12:17 AM

## 2020-02-29 NOTE — Telephone Encounter (Signed)
Patient called back and was scheduled for 03/21/2020 at 3:45 with Sande Rives, [place].

## 2020-03-14 NOTE — Progress Notes (Signed)
Cardiology Office Note:    Date:  03/21/2020   ID:  Kelli Lucas, DOB July 25, 1929, MRN [id number]  PCP:  Christain Sacramento, MD  Cardiologist:  Sherren Mocha, MD  Electrophysiologist:  None   Referring MD: Christain Sacramento, MD   Chief Complaint: pre-op evaluation  History of Present Illness:    Kelli Lucas is a 86 y.o. female with a history of palpitations, stroke in 2013, hypertensin, and hyperlipidemia who is followed by Dr. Burt Knack and presents today for pre-op evaluation.   Patient has longstanding history of palpitations without any specific diagnosis of arrhythmia. She had a stroke in 2013. TEE showed no pertinent finding and Event Monitor showed no evidence of atrial fibrillation. Palpitation managed with Cardizem CD 154m daily and Toprol-XL 544mdaily. Patient was last seen by Dr. CoBurt Knackn 05/2019 at which time she noted some occasional breakthrough palpitations. She was advised that she could take an extra 1/2 of Toprol-XL on an as-needed basis.    Patient presents today for pre-op evaluation for upcoming left total hip arthroplasty. Patient doing well from cardiac standpoint. No chest pain, shortness of breath, orthopnea, PND, lower extremity edema. She has occasional palpitations but no recent prolonged episodes. Well controlled on current dose of Cardizem and Toprol. No lightheadedness, dizziness, or syncope. Her only complaint today is hip and knee pain. Activity is limited by hip/knee pain but she is still able to complete >4.0 without any chest pain or shortness of breath. She is able to complete all activities of daily living, wall inside, and preform household chores such as carrying in the groceries, taking out the trash, and vacuuming. She states she can go up a flight of steps but it is difficult due to her leg pain (no chest pain or shortness of breath with this).  Reviewed Recent Labs from PCP's Office on 03/15/2020 (in Care Everywhere): - CMET: Na 142, K 3.9, Glucose 84, BUN  17, Creatinine 0.80, Albumin 4.2, Total Bili 0.7, Alk Phos 76, AST 18, ALT 12.  - Lipid panel: Total cholesterol 139, Triglycerides 81, HDL 45, LDL 77. - TSH 3.199.  Past Medical History:  Diagnosis Date  . Heart palpitations     lifelong symptoms  . HLD (hyperlipidemia)    takes lipitor-last lipid panel about 3 mo ago, and was good  . Hypertension   . Leukemia, chronic lymphoid ([place]   chronic lymphocytis Leukemia-MD HuHarlow AsaJaThurmon Fairad this for about about 1 yr.   Is no t on any current therapy-is being watcvhed  . Panic attacks     Past Surgical History:  Procedure Laterality Date  . BREAST LUMPECTOMY    . TEE WITHOUT CARDIOVERSION  10/15/2011   Procedure: TRANSESOPHAGEAL ECHOCARDIOGRAM (TEE);  Surgeon: PaFay RecordsMD;  [facility];  Service: Cardiovascular;  Laterality: N/A;    Current Medications: Current Meds  Medication Sig  . aspirin EC 325 MG tablet Take 325 mg by mouth daily.  . Marland Kitchentorvastatin (LIPITOR) 40 MG tablet Take 40 mg by mouth daily.  . Calcium-Magnesium-Vitamin D (CALCIUM 500 PO) Take 500 mg by mouth daily.  . cholecalciferol (VITAMIN D) 1000 UNITS tablet Take 1,000 Units by mouth daily.  . Cyanocobalamin (VITAMIN B 12 PO) Take 5,000 mcg by mouth daily.  . Marland Kitcheniltiazem (CARDIZEM CD) 180 MG 24 hr capsule Take 180 mg by mouth daily.  . hydrochlorothiazide (HYDRODIURIL) 12.5 MG tablet Take 12.5 mg by mouth daily.  . metoprolol succinate (TOPROL-XL) 50 MG 24 hr tablet Take 50  mg by mouth daily. Take with or immediately following a meal.     Allergies:   Phenobarbital, Penicillins, and Sulfa antibiotics   Social History   Socioeconomic History  . Marital status: Married    Spouse name: Not on file  . Number of children: Not on file  . Years of education: Not on file  . Highest education level: Not on file  Occupational History  . Not on file  Tobacco Use  . Smoking status: Never Smoker  . Smokeless tobacco: Never Used  Substance and Sexual  Activity  . Alcohol use: No  . Drug use: No  . Sexual activity: Not on file  Other Topics Concern  . Not on file  Social History Narrative   Worked with the [employer]-workwed there and then married   Network engineer, office work-   Social Determinants of Radio broadcast assistant Strain:   . Difficulty of Paying Living Expenses: Not on file  Food Insecurity:   . Worried About Charity fundraiser in the Last Year: Not on file  . Ran Out of Food in the Last Year: Not on file  Transportation Needs:   . Lack of Transportation (Medical): Not on file  . Lack of Transportation (Non-Medical): Not on file  Physical Activity:   . Days of Exercise per Week: Not on file  . Minutes of Exercise per Session: Not on file  Stress:   . Feeling of Stress : Not on file  Social Connections:   . Frequency of Communication with Friends and Family: Not on file  . Frequency of Social Gatherings with Friends and Family: Not on file  . Attends Religious Services: Not on file  . Active Member of Clubs or Organizations: Not on file  . Attends Archivist Meetings: Not on file  . Marital Status: Not on file     Family History: The patient's family history includes Coronary artery disease in her father; Heart failure in her mother; Stroke in her father.  ROS:   Please see the history of present illness.     EKGs/Labs/Other Studies Reviewed:    The following studies were reviewed today:  Echocardiogram 10/14/2011: Study Conclusions: - Left ventricle: The cavity size was normal. Wall thickness  was increased in a pattern of mild LVH. Systolic function  was normal. The estimated ejection fraction was in the  range of 55% to 60%. Wall motion was normal; there were no  regional wall motion abnormalities. Doppler parameters are  consistent with abnormal left ventricular relaxation  (grade 1 diastolic dysfunction).  - Aortic valve: There was no stenosis.  - Mitral valve: Trivial  regurgitation.  - Left atrium: The atrium was mildly dilated.  - Right ventricle: The cavity size was normal. Systolic  function was normal.  - Pulmonary arteries: No complete TR doppler jet so unable  to estimate PA systolic pressure.  - Inferior vena cava: The vessel was normal in size; the  respirophasic diameter changes were in the normal range (= 50%); findings are consistent with normal central venouspressure.   Impressions:  - Normal LV size and systolic function, mild LV hypertrophy.  EF 55-60%. Normal RV size and systolic function. No  significant valvular abnormalities.    EKG:  EKG ordered today. EKG personally reviewed and demonstrates normal sinus rhythm, rate 67 bpm, with non-specific ST/T changes but no acute changes compared to prior tracings. Normal axis. Normal PR and QRS intervals. QTC 426 ms.  Recent Labs: No results  found for requested labs within last 8760 hours.  Recent Lipid Panel    Component Value Date/Time   CHOL 154 10/13/2011 0500   TRIG 105 10/13/2011 0500   HDL 47 10/13/2011 0500   CHOLHDL 3.3 10/13/2011 0500   VLDL 21 10/13/2011 0500   LDLCALC 86 10/13/2011 0500    Physical Exam:    Vital Signs: BP 134/60   Pulse 67   Ht 4' 11" (1.499 m)   Wt 112 lb (50.8 kg)   SpO2 96%   BMI 22.62 kg/m     Wt Readings from Last 3 Encounters:  03/21/20 112 lb (50.8 kg)  05/15/19 114 lb 12.8 oz (52.1 kg)  08/19/18 119 lb (54 kg)     General: 86 y.o. female in no acute distress. HEENT: Normocephalic and atraumatic. Sclera clear.  Neck: Supple. No carotid bruits. No JVD. Heart: RRR. Distinct S1 and S2. No murmurs, gallops, or rubs. Radial pulses 2+ and equal bilaterally. Lungs: No increased work of breathing. Clear to ausculation bilaterally. No wheezes, rhonchi, or rales.  Abdomen: Soft, non-distended, and non-tender to palpation. Bowel sounds present. Extremities: No lower extremity edema.    Skin: Warm and dry. Neuro: Alert and oriented  x3. No focal deficits. Psych: Normal affect. Responds appropriately.   Assessment:    1. Pre-op evaluation   2. Palpitations   3. Essential hypertension   4. Hyperlipidemia, unspecified hyperlipidemia type   5. History of stroke     Plan:    Pre-op Evaluation - Patient has left total hip arthroplasty planned. - Patient doing well from a cardiac standpoint. No anginal symptoms or acute CHF symptoms. - Patient able to complete >4.0 METS without any problems (other than hip pain). Per Revised Cardiac Risk Index, considered low risk with 6.0% chance of adverse cardiac events. However, this does not take into account patient's advanced age. Therefore, based on ACC/AHA guidelines, she would be at acceptable risk for the planned procedure without further cardiovascular testing. OK to hold Aspirin from a cardiac standpoint but would check with PCP given she is on this for prior stroke. I will route this recommendation to the requesting party via Epic fax function.  Of note, procedure initially scheduled for 03/30/2020 but has been postponed due to elective procedure being cancelled with COVID. If procedure is rescheduled within the next 2 months, OK to proceed. If it will be longer, patient will need to be called to ensure no change in symptoms.   Palpitations - Stable on current regimen. - Continue Cardizem CD 13m daily and Toprol-XL 598mdaily.   Hypertension - BP well controlled.  - Continue HCTZ 12.79m9maily, Cardizem 180m94mily, and Toprol-XL 50mg8mly.  - Renal function stable on recent CMET at PCP's office.  Hyperlipidemia - Total lipid panel from 03/15/2020 (from Care Everywhere): Total Cholesterol 139, Triglycerides 81, HDL 45, LDL 77. - Continue Lipitor 40mg 579my.  - Labs followed by PCP.   Stroke - History of stroke in 2013.  - Maintained on Aspirin 3279mg d42m. Also on statin.  Disposition: Patient already has follow-up with Dr. Cooper Burt Knackled for  05/2020.   Medication Adjustments/Labs and Tests Ordered: Current medicines are reviewed at length with the patient today.  Concerns regarding medicines are outlined above.  No orders of the defined types were placed in this encounter.  No orders of the defined types were placed in this encounter.   Patient Instructions  Medication Instructions:  No medication changes.  *If you need a refill  on your cardiac medications before your next appointment, please call your pharmacy*   Lab Work: If you have labs (blood work) drawn today and your tests are completely normal, you will receive your results only by: Marland Kitchen MyChart Message (if you have MyChart) OR . A paper copy in the mail If you have any lab test that is abnormal or we need to change your treatment, we will call you to review the results.   Testing/Procedures: EKG performed today.  Follow-Up: At [facility], you and your health needs are our priority.  As part of our continuing mission to provide you with exceptional heart care, we have created designated Provider Care Teams.  These Care Teams include your primary Cardiologist (physician) and Advanced Practice Providers (APPs -  Physician Assistants and Nurse Practitioners) who all work together to provide you with the care you need, when you need it.  We recommend signing up for the patient portal called "MyChart".  Sign up information is provided on this After Visit Summary.  MyChart is used to connect with patients for Virtual Visits (Telemedicine).  Patients are able to view lab/test results, encounter notes, upcoming appointments, etc.  Non-urgent messages can be sent to your provider as well.   To learn more about what you can do with MyChart, go to [email or web address].    Your next appointment:   Your next appointment is scheduled for 05/16/2020 at 2:20pm with Dr. Burt Knack.   Other Instructions - You are OK for  - Please continue all medications.  - Please let us  know if your palpitations worsen.      Signed, Darreld Mclean, PA-C  03/21/2020 5:34 PM    [facility]

## 2020-03-21 ENCOUNTER — Other Ambulatory Visit: Payer: Self-pay

## 2020-03-21 ENCOUNTER — Encounter: Payer: Self-pay | Admitting: Student

## 2020-03-21 ENCOUNTER — Ambulatory Visit (INDEPENDENT_AMBULATORY_CARE_PROVIDER_SITE_OTHER): Payer: Medicare Other | Admitting: Student

## 2020-03-21 VITALS — BP 134/60 | HR 67 | Ht 59.0 in | Wt 112.0 lb

## 2020-03-21 DIAGNOSIS — I10 Essential (primary) hypertension: Secondary | ICD-10-CM

## 2020-03-21 DIAGNOSIS — Z8673 Personal history of transient ischemic attack (TIA), and cerebral infarction without residual deficits: Secondary | ICD-10-CM

## 2020-03-21 DIAGNOSIS — R002 Palpitations: Secondary | ICD-10-CM

## 2020-03-21 DIAGNOSIS — E785 Hyperlipidemia, unspecified: Secondary | ICD-10-CM | POA: Diagnosis not present

## 2020-03-21 DIAGNOSIS — Z01818 Encounter for other preprocedural examination: Secondary | ICD-10-CM

## 2020-03-21 NOTE — Patient Instructions (Signed)
Medication Instructions:  No medication changes.  *If you need a refill on your cardiac medications before your next appointment, please call your pharmacy*   Lab Work: If you have labs (blood work) drawn today and your tests are completely normal, you will receive your results only by: Marland Kitchen MyChart Message (if you have MyChart) OR . A paper copy in the mail If you have any lab test that is abnormal or we need to change your treatment, we will call you to review the results.   Testing/Procedures: EKG performed today.  Follow-Up: At [facility], you and your health needs are our priority.  As part of our continuing mission to provide you with exceptional heart care, we have created designated Provider Care Teams.  These Care Teams include your primary Cardiologist (physician) and Advanced Practice Providers (APPs -  Physician Assistants and Nurse Practitioners) who all work together to provide you with the care you need, when you need it.  We recommend signing up for the patient portal called "MyChart".  Sign up information is provided on this After Visit Summary.  MyChart is used to connect with patients for Virtual Visits (Telemedicine).  Patients are able to view lab/test results, encounter notes, upcoming appointments, etc.  Non-urgent messages can be sent to your provider as well.   To learn more about what you can do with MyChart, go to [email or web address].    Your next appointment:   Your next appointment is scheduled for 05/16/2020 at 2:20pm with Dr. Burt Knack.   Other Instructions - You are OK for  - Please continue all medications.  - Please let us know if your palpitations worsen.

## 2020-03-22 ENCOUNTER — Encounter (HOSPITAL_COMMUNITY): Payer: Medicare Other

## 2020-03-30 ENCOUNTER — Ambulatory Visit: Admit: 2020-03-30 | Payer: Medicare Other | Admitting: Orthopedic Surgery

## 2020-03-30 SURGERY — ARTHROPLASTY, HIP, TOTAL, ANTERIOR APPROACH
Anesthesia: Choice | Site: Hip | Laterality: Left

## 2020-04-04 NOTE — Patient Instructions (Signed)
DUE TO COVID-19 ONLY ONE VISITOR IS ALLOWED TO COME WITH YOU AND STAY IN THE WAITING ROOM ONLY DURING PRE OP AND PROCEDURE DAY OF SURGERY. THE 1 VISITOR  MAY VISIT WITH YOU AFTER SURGERY IN YOUR PRIVATE ROOM DURING VISITING HOURS ONLY!  YOU NEED TO HAVE A COVID 19 TEST ON    10/2__ @_______ , THIS TEST MUST BE DONE BEFORE SURGERY,  COVID TESTING SITE [address] [place] [address], IT IS ON THE RIGHT GOING OUT [address] APPROXIMATELY  2 MINUTES PAST [employer] ON THE RIGHT. ONCE YOUR COVID TEST IS COMPLETED,  PLEASE BEGIN THE QUARANTINE INSTRUCTIONS AS OUTLINED IN YOUR HANDOUT.                Kelli Lucas    Your procedure is scheduled on: 10?6/21   Report to [place]  Entrance   Report to admitting at 11:25 AM     Call this number if you have problems the morning of surgery [place], NO [place].   No food after midnight  .  You may have clear liquid until 10:30 AM.    CLEAR LIQUID DIET   Foods Allowed                                                                     Foods Excluded  Coffee and tea, regular and decaf                             liquids that you cannot  Plain Jell-O any favor except red or purple                                           see through such as: Fruit ices (not with fruit pulp)                                     milk, soups, orange juice  Iced Popsicles                                    All solid food Carbonated beverages, regular and diet                                    Cranberry, grape and apple juices Sports drinks like Gatorade Lightly seasoned clear broth or consume(fat free) Sugar, honey syrup        At 10:30 AM drink pre surgery drink.   Nothing by mouth after 10:30 AM.   Take these medicines the morning of surgery with A SIP OF WATER: Diltiazem  You may not have  any metal on your body including hair pins and              piercings  Do not wear jewelry, make-up, lotions, powders or perfumes, deodorant             Do not wear nail polish on your fingernails.  Do not shave  48 hours prior to surgery.             Do not bring valuables to the hospital. [place].  Contacts, dentures or bridgework may not be worn into surgery.      Patients discharged the day of surgery will not be allowed to drive home.   IF YOU ARE HAVING SURGERY AND GOING HOME THE SAME DAY, YOU MUST HAVE AN ADULT TO DRIVE YOU HOME AND BE WITH YOU FOR 24 HOURS.   YOU MAY GO HOME BY TAXI OR UBER OR ORTHERWISE, BUT AN ADULT MUST ACCOMPANY YOU HOME AND STAY WITH YOU FOR 24 HOURS.  Name and phone number of your driver:  Special Instructions: N/A              Please read over the following fact sheets you were given: _____________________________________________________________________             [facility] - Preparing for Surgery  Before surgery, you can play an important role.   Because skin is not sterile, your skin needs to be as free of germs as possible.   You can reduce the number of germs on your skin by washing with CHG (chlorahexidine gluconate) soap before surgery.   CHG is an antiseptic cleaner which kills germs and bonds with the skin to continue killing germs even after washing. Please DO NOT use if you have an allergy to CHG or antibacterial soaps .  If your skin becomes reddened/irritated stop using the CHG and inform your nurse when you arrive at Short Stay. Do not shave (including legs and underarms) for at least 48 hours prior to the first CHG shower.   Please follow these instructions carefully:  1.  Shower with CHG Soap the night before surgery and the  morning of Surgery.  2.  If you choose to wash your hair, wash your hair first as usual with your  normal  shampoo.  3.  After you shampoo, rinse your hair  and body thoroughly to remove the  shampoo.                                        4.  Use CHG as you would any other liquid soap.  You can apply chg directly  to the skin and wash                       Gently with a scrungie or clean washcloth.  5.  Apply the CHG Soap to your body ONLY FROM THE NECK DOWN.   Do not use on face/ open                           Wound or open sores. Avoid contact with eyes, ears mouth and genitals (private parts).  Wash face,  Genitals (private parts) with your normal soap.             6.  Wash thoroughly, paying special attention to the area where your surgery  will be performed.  7.  Thoroughly rinse your body with warm water from the neck down.  8.  DO NOT shower/wash with your normal soap after using and rinsing off  the CHG Soap.             9.  Pat yourself dry with a clean towel.            10.  Wear clean pajamas.            11.  Place clean sheets on your bed the night of your first shower and do not  sleep with pets. Day of Surgery : Do not apply any lotions/deodorants the morning of surgery.  Please wear clean clothes to the hospital/surgery center.  FAILURE TO FOLLOW THESE INSTRUCTIONS MAY RESULT IN THE CANCELLATION OF YOUR SURGERY PATIENT SIGNATURE_________________________________  NURSE SIGNATURE__________________________________  ________________________________________________________________________   Kelli Lucas  An incentive spirometer is a tool that can help keep your lungs clear and active. This tool measures how well you are filling your lungs with each breath. Taking long deep breaths may help reverse or decrease the chance of developing breathing (pulmonary) problems (especially infection) following:  A long period of time when you are unable to move or be active. BEFORE THE PROCEDURE   If the spirometer includes an indicator to show your best effort, your nurse or respiratory therapist will set it to a  desired goal.  If possible, sit up straight or lean slightly forward. Try not to slouch.  Hold the incentive spirometer in an upright position. INSTRUCTIONS FOR USE  1. Sit on the edge of your bed if possible, or sit up as far as you can in bed or on a chair. 2. Hold the incentive spirometer in an upright position. 3. Breathe out normally. 4. Place the mouthpiece in your mouth and seal your lips tightly around it. 5. Breathe in slowly and as deeply as possible, raising the piston or the ball toward the top of the column. 6. Hold your breath for 3-5 seconds or for as long as possible. Allow the piston or ball to fall to the bottom of the column. 7. Remove the mouthpiece from your mouth and breathe out normally. 8. Rest for a few seconds and repeat Steps 1 through 7 at least 10 times every 1-2 hours when you are awake. Take your time and take a few normal breaths between deep breaths. 9. The spirometer may include an indicator to show your best effort. Use the indicator as a goal to work toward during each repetition. 10. After each set of 10 deep breaths, practice coughing to be sure your lungs are clear. If you have an incision (the cut made at the time of surgery), support your incision when coughing by placing a pillow or rolled up towels firmly against it. Once you are able to get out of bed, walk around indoors and cough well. You may stop using the incentive spirometer when instructed by your caregiver.  RISKS AND COMPLICATIONS  Take your time so you do not get dizzy or light-headed.  If you are in pain, you may need to take or ask for pain medication before doing incentive spirometry. It is harder to take a deep breath if you are having pain. AFTER USE  Rest and breathe slowly and easily.  It can be helpful to keep track of a log of your progress. Your caregiver can provide you with a simple table to help with this. If you are using the spirometer at home, follow these  instructions: [place] IF:   You are having difficultly using the spirometer.  You have trouble using the spirometer as often as instructed.  Your pain medication is not giving enough relief while using the spirometer.  You develop fever of 100.5 F (38.1 C) or higher. SEEK IMMEDIATE MEDICAL CARE IF:   You cough up bloody sputum that had not been present before.  You develop fever of 102 F (38.9 C) or greater.  You develop worsening pain at or near the incision site. MAKE SURE YOU:   Understand these instructions.  Will watch your condition.  Will get help right away if you are not doing well or get worse. Document Released: 11/05/2006 Document Revised: 09/17/2011 Document Reviewed: 01/06/2007 [facility] Patient Information 2014 [place], [place].   ________________________________________________________________________

## 2020-04-05 ENCOUNTER — Encounter (HOSPITAL_COMMUNITY)
Admission: RE | Admit: 2020-04-05 | Discharge: 2020-04-05 | Disposition: A | Discharge status: Home / Self Care | Payer: Medicare Other | Source: Ambulatory Visit | Attending: Orthopedic Surgery | Admitting: Orthopedic Surgery

## 2020-04-05 ENCOUNTER — Other Ambulatory Visit: Payer: Self-pay

## 2020-04-05 ENCOUNTER — Encounter (HOSPITAL_COMMUNITY): Payer: Self-pay

## 2020-04-05 DIAGNOSIS — Z01812 Encounter for preprocedural laboratory examination: Secondary | ICD-10-CM | POA: Diagnosis not present

## 2020-04-05 HISTORY — DX: Gastro-esophageal reflux disease without esophagitis: K21.9

## 2020-04-05 HISTORY — DX: Cardiac arrhythmia, unspecified: I49.9

## 2020-04-05 HISTORY — DX: Headache, unspecified: R51.9

## 2020-04-05 HISTORY — DX: Angina pectoris, unspecified: I20.9

## 2020-04-05 HISTORY — DX: Personal history of other diseases of the digestive system: Z87.19

## 2020-04-05 HISTORY — DX: Nausea with vomiting, unspecified: R11.2

## 2020-04-05 HISTORY — DX: Unspecified osteoarthritis, unspecified site: M19.90

## 2020-04-05 HISTORY — DX: Other complications of anesthesia, initial encounter: T88.59XA

## 2020-04-05 HISTORY — DX: Other specified postprocedural states: Z98.890

## 2020-04-05 HISTORY — DX: Chronic lymphocytic leukemia of B-cell type not having achieved remission: C91.10

## 2020-04-05 LAB — COMPREHENSIVE METABOLIC PANEL
ALT: 15 U/L (ref 0–44)
AST: 24 U/L (ref 15–41)
Albumin: 4.4 g/dL (ref 3.5–5.0)
Alkaline Phosphatase: 74 U/L (ref 38–126)
Anion gap: 9 (ref 5–15)
BUN: 19 mg/dL (ref 8–23)
CO2: 27 mmol/L (ref 22–32)
Calcium: 10.1 mg/dL (ref 8.9–10.3)
Chloride: 101 mmol/L (ref 98–111)
Creatinine, Ser: 0.69 mg/dL (ref 0.44–1.00)
GFR calc Af Amer: >60 mL/min (ref >60)
GFR calc non Af Amer: >60 mL/min (ref >60)
Glucose, Bld: 125 mg/dL — ABNORMAL HIGH (ref 70–99)
Potassium: 4.3 mmol/L (ref 3.5–5.1)
Sodium: 137 mmol/L (ref 135–145)
Total Bilirubin: 0.7 mg/dL (ref 0.3–1.2)
Total Protein: 7.2 g/dL (ref 6.5–8.1)

## 2020-04-05 LAB — PROTIME-INR
INR: 1 (ref 0.8–1.2)
Prothrombin Time: 12.8 seconds (ref 11.4–15.2)

## 2020-04-05 LAB — CBC
HCT: 43.7 % (ref 36.0–46.0)
Hemoglobin: 14.2 g/dL (ref 12.0–15.0)
MCH: 30.8 pg (ref 26.0–34.0)
MCHC: 32.5 g/dL (ref 30.0–36.0)
MCV: 94.8 fL (ref 80.0–100.0)
Platelets: 230 10*3/uL (ref 150–400)
RBC: 4.61 MIL/uL (ref 3.87–5.11)
RDW: 13.2 % (ref 11.5–15.5)
WBC: 26.9 10*3/uL — ABNORMAL HIGH (ref 4.0–10.5)
nRBC: 0.1 % (ref 0.0–0.2)

## 2020-04-05 LAB — SURGICAL PCR SCREEN
MRSA, PCR: NEGATIVE
Staphylococcus aureus: NEGATIVE

## 2020-04-05 LAB — APTT: aPTT: 30 seconds (ref 24–36)

## 2020-04-05 NOTE — Progress Notes (Signed)
COVID Vaccine Completed:No Date COVID Vaccine completed: COVID vaccine manufacturer: Pfizer    [employer]   PCP - Dr. Tillman Sers Cardiologist - Dr. Ezzie Dural  Chest x-ray - no EKG - 03/21/20- Epic Stress Test - no ECHO - 2013 Cardiac Cath -  Pacemaker/ICD device last checked:  Sleep Study - no CPAP -   Fasting Blood Sugar - NA Checks Blood Sugar _____ times a day  Blood Thinner Instructions: ASA/ wilson Aspirin Instructions:Stop 5 days prior to DOS/ [place] Last Dose:04/08/20  Anesthesia review:   Patient denies shortness of breath, fever, cough and chest pain at PAT appointment  yes   Patient verbalized understanding of instructions that were given to them at the PAT appointment. Patient was also instructed that they will need to review over the PAT instructions again at home before surgery. Yes Pt reports no SOB climbing stairs, doing housework or with ADLs  Pt says that she has had heart palpitation all of her life. 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

## 2020-04-05 NOTE — H&P (Signed)
TOTAL HIP ADMISSION H&P  Patient is admitted for left total hip arthroplasty.  Subjective:  Chief Complaint: Left hip pain  HPI: Kelli Lucas, 86 y.o. female, has a history of pain and functional disability in the left hip due to arthritis and patient has failed non-surgical conservative treatments for greater than 12 weeks to include corticosteriod injections and activity modification. Onset of symptoms was gradual, starting several years ago with gradually worsening course since that time. The patient noted no past surgery on the left hip. Patient currently rates pain in the left hip at 10 out of 10 with activity. Patient has worsening of pain with activity and weight bearing, pain that interfers with activities of daily living and crepitus. Patient has evidence of end-stage arthritis, bone on bone, with cystic formation by imaging studies. This condition presents safety issues increasing the risk of falls. There is no current active infection.  Patient Active Problem List   Diagnosis Date Noted  . Preoperative clearance 08/19/2018  . CVA-L frontal area 4/6 10/12/2011  . HLD (hyperlipidemia)   . Leukemia, chronic lymphoid ([employer])   . Hypertension     Past Medical History:  Diagnosis Date  . Anginal pain ([place])   . Arthritis    knees, hip  . Chronic lymphoid leukemia ([place])   . Complication of anesthesia   . Dysrhythmia    from teenager on  . GERD (gastroesophageal reflux disease)   . Headache    Hx  . Heart palpitations     lifelong symptoms  . History of hiatal hernia   . HLD (hyperlipidemia)    takes lipitor-last lipid panel about 3 mo ago, and was good  . Hypertension   . Leukemia, chronic lymphoid ([place])    chronic lymphocytis Leukemia-MD Harlow Asa, [place] Fair had this for about about 1 yr.   Is no t on any current therapy-is being watcvhed  . Panic attacks    Hx  . PONV (postoperative nausea and vomiting)   . Stroke [facility]) 2013    Past Surgical History:  Procedure Laterality  Date  . BREAST LUMPECTOMY Left 1980s   lumpectomy  . EYE SURGERY Bilateral 2019, 2020  . [place]  . TEE WITHOUT CARDIOVERSION  10/15/2011   Procedure: TRANSESOPHAGEAL ECHOCARDIOGRAM (TEE);  Surgeon: Fay Records, MD;  Location[facility] ENDOSCOPY;  Service: Cardiovascular;  Laterality: N/A;  . TONSILLECTOMY      Prior to Admission medications   Medication Sig Start Date End Date Taking? Authorizing Provider  aspirin EC 325 MG tablet Take 325 mg by mouth every evening.    Yes [provider]  atorvastatin (LIPITOR) 40 MG tablet Take 40 mg by mouth daily at 6 PM.    Yes [provider]  Cholecalciferol (VITAMIN D-3) 125 MCG (5000 UT) TABS Take 5,000 Units by mouth in the morning and at bedtime.   Yes [provider]  Cyanocobalamin (VITAMIN B-12) 5000 MCG SUBL Place 5,000 mcg under the tongue daily.   Yes [provider]  diltiazem (CARDIZEM CD) 180 MG 24 hr capsule Take 180 mg by mouth daily.   Yes [provider]  hydrochlorothiazide (HYDRODIURIL) 12.5 MG tablet Take 12.5 mg by mouth daily.   Yes [provider]  metoprolol succinate (TOPROL-XL) 50 MG 24 hr tablet Take 50 mg by mouth every evening. Take with or immediately following a meal.    Yes [provider]  Zinc 50 MG TABS Take 50 mg by mouth daily.   Yes [provider]    Allergies  Allergen Reactions  . Phenobarbital Nausea And Vomiting  . Penicillins Rash  . Sulfa Antibiotics Rash    Social History   Socioeconomic History  . Marital status: Married    Spouse name: Not on file  . Number of children: Not on file  . Years of education: Not on file  . Highest education level: Not on file  Occupational History  . Not on file  Tobacco Use  . Smoking status: Never Smoker  . Smokeless tobacco: Never Used  Vaping Use  . Vaping Use: Never used  Substance and Sexual Activity  . Alcohol use: No  . Drug use: No  . Sexual activity: Not on file   Other Topics Concern  . Not on file  Social History Narrative   Worked with [employer]-workwed there and then married   Network engineer, office work-   Social Determinants of Radio broadcast assistant Strain:   . Difficulty of Paying Living Expenses: Not on file  Food Insecurity:   . Worried About Charity fundraiser in the Last Year: Not on file  . Ran Out of Food in the Last Year: Not on file  Transportation Needs:   . Lack of Transportation (Medical): Not on file  . Lack of Transportation (Non-Medical): Not on file  Physical Activity:   . Days of Exercise per Week: Not on file  . Minutes of Exercise per Session: Not on file  Stress:   . Feeling of Stress : Not on file  Social Connections:   . Frequency of Communication with Friends and Family: Not on file  . Frequency of Social Gatherings with Friends and Family: Not on file  . Attends Religious Services: Not on file  . Active Member of Clubs or Organizations: Not on file  . Attends Archivist Meetings: Not on file  . Marital Status: Not on file  Intimate Partner Violence:   . Fear of Current or Ex-Partner: Not on file  . Emotionally Abused: Not on file  . Physically Abused: Not on file  . Sexually Abused: Not on file      Tobacco Use: Low Risk   . Smoking Tobacco Use: Never Smoker  . Smokeless Tobacco Use: Never Used   Social History   Substance and Sexual Activity  Alcohol Use No    Family History  Problem Relation Age of Onset  . Heart failure Mother        liverd till 58  . Stroke Father        LIVED TO 61 YR OF AGE  . Coronary artery disease Father        HEART ATTACK WHERN IN 80'S    Review of Systems  Constitutional: Negative for chills and fever.  HENT: Negative for congestion, sore throat and tinnitus.   Eyes: Negative for double vision, photophobia and pain.  Respiratory: Negative for cough, shortness of breath and wheezing.   Cardiovascular: Negative for chest pain, palpitations and  orthopnea.  Gastrointestinal: Negative for heartburn, nausea and vomiting.  Genitourinary: Negative for dysuria, frequency and urgency.  Musculoskeletal: Positive for joint pain.  Neurological: Negative for dizziness, weakness and headaches.     Objective:  Physical Exam: Well nourished and well developed.  General: Alert and oriented x3, cooperative and pleasant, no acute distress.  Head: normocephalic, atraumatic, neck supple.  Eyes: EOMI.  Respiratory: breath sounds clear in all fields, no wheezing, rales, or rhonchi. Cardiovascular: Regular rate and rhythm,  no murmurs, gallops or rubs.  Abdomen: non-tender to palpation and soft, normoactive bowel sounds. Musculoskeletal:  Left Hip Exam:  The range of motion: Flexion to 100 degrees, No Internal Rotation, External Rotation to 20 degrees, and abduction to 20 degrees without discomfort.  There is no tenderness over the greater trochanteric bursa.   Calves soft and nontender. Motor function intact in LE. Strength 5/5 LE bilaterally. Neuro: Distal pulses 2+. Sensation to light touch intact in LE.  Vital signs in last 24 hours: Temp:  [98.2 F (36.8 C)] 98.2 F (36.8 C) (09/28 1301) Pulse Rate:  [97] 97 (09/28 1301) Resp:  [16] 16 (09/28 1301) BP: (151)/(47) 151/47 (09/28 1301) SpO2:  [97 %] 97 % (09/28 1301) Weight:  [49.9 kg] 49.9 kg (09/28 1301)  Imaging Review Plain radiographs demonstrate severe degenerative joint disease of the left hip. The bone quality appears to be adequate for age and reported activity level.  Assessment/Plan:  End stage arthritis, left hip  The patient history, physical examination, clinical judgement of the provider and imaging studies are consistent with end stage degenerative joint disease of the left hip and total hip arthroplasty is deemed medically necessary. The treatment options including medical management, injection therapy, arthroscopy and arthroplasty were discussed at length. The risks  and benefits of total hip arthroplasty were presented and reviewed. The risks due to aseptic loosening, infection, stiffness, dislocation/subluxation, thromboembolic complications and other imponderables were discussed. The patient acknowledged the explanation, agreed to proceed with the plan and consent was signed. Patient is being admitted for inpatient treatment for surgery, pain control, PT, OT, prophylactic antibiotics, VTE prophylaxis, progressive ambulation and ADLs and discharge planning.The patient is planning to be discharged home.  Anticipated LOS equal to or greater than 2 midnights due to - Age 56 and older with one or more of the following:  - Obesity  - Expected need for hospital services (PT, OT, Nursing) required for safe  discharge  - Anticipated need for postoperative skilled nursing care or inpatient rehab  - Active co-morbidities: Stroke OR   - Unanticipated findings during/Post Surgery: None  - Patient is a high risk of re-admission due to: None  Therapy Plans: HEP Disposition: Home with daughter Planned DVT Prophylaxis: Eliquis 2.5 mg BID DME Needed: 3-in-1 PCP: Kathryne Eriksson, MD (clearance provided) Cardiologist: Sherren Mocha, MD (clearance provided) Hematologist: Jacqulyn Ducking, MD (clearance provided) TXA: IV Allergies: PCN (rash), sulfa (rash) Anesthesia Concerns: Nausea/vomiting BMI: 21.8 Last HgbA1c: Not diabetic.  Pharmacy: [facility])  Other: - PMH: Leukemia, CVA (2013) - Husband with bad experience with Xarelto  - Patient was instructed on what medications to stop prior to surgery. - Follow-up visit in 2 weeks with Dr. Wynelle Link - Begin physical therapy following surgery - Pre-operative lab work as pre-surgical testing - Prescriptions will be provided in hospital at time of discharge  Theresa Duty, PA-C Orthopedic Surgery [facility] [place]

## 2020-04-06 NOTE — Anesthesia Preprocedure Evaluation (Addendum)
Anesthesia Evaluation  Patient identified by MRN, date of birth, ID band Patient awake    Reviewed: Allergy & Precautions, NPO status , Patient's Chart, lab work & pertinent test results, reviewed documented beta blocker date and time   History of Anesthesia Complications (+) PONV and history of anesthetic complications  Airway Mallampati: I       Dental no notable dental hx.    Pulmonary    Pulmonary exam normal        Cardiovascular hypertension, Pt. on medications and Pt. on home beta blockers Normal cardiovascular exam     Neuro/Psych    GI/Hepatic Neg liver ROS,   Endo/Other  negative endocrine ROS  Renal/GU negative Renal ROS  negative genitourinary   Musculoskeletal  (+) Arthritis , Osteoarthritis,    Abdominal Normal abdominal exam  (+)   Peds  Hematology negative hematology ROS (+)   Anesthesia Other Findings   Reproductive/Obstetrics                           Anesthesia Physical Anesthesia Plan  ASA: II  Anesthesia Plan: Spinal   Post-op Pain Management:    Induction:   PONV Risk Score and Plan: 3 and Ondansetron  Airway Management Planned: Natural Airway and Simple Face Mask  Additional Equipment: None  Intra-op Plan:   Post-operative Plan:   Informed Consent: I have reviewed the patients History and Physical, chart, labs and discussed the procedure including the risks, benefits and alternatives for the proposed anesthesia with the patient or authorized representative who has indicated his/her understanding and acceptance.       Plan Discussed with: CRNA  Anesthesia Plan Comments: (Per Cardiology OV note 03/21/2020,  "Pre-op Evaluation - Patient has left total hip arthroplasty planned. - Patient doing well from a cardiac standpoint. No anginal symptoms or acute CHF symptoms. - Patient able to complete >4.0 METS without any problems (other than hip pain). Per  Revised Cardiac Risk Index, considered low risk with 6.0% chance of adverse cardiac events. However, this does not take into account patient's advanced age. Therefore, based on ACC/AHA guidelines, she would be at acceptable risk for the planned procedure without further cardiovascular testing. OK to hold Aspirin from a cardiac standpoint but would check with PCP given she is on this for prior stroke. I will route this recommendation to the requesting party via Epic fax function. Of note, procedure initially scheduled for 03/30/2020 but has been postponed due to elective procedure being cancelled with COVID. If procedure is rescheduled within the next 2 months, OK to proceed. If it will be longer, patient will need to be called to ensure no change in symptoms.")       Anesthesia Quick Evaluation

## 2020-04-09 ENCOUNTER — Other Ambulatory Visit (HOSPITAL_COMMUNITY)
Admission: RE | Admit: 2020-04-09 | Discharge: 2020-04-09 | Disposition: A | Discharge status: Home / Self Care | Payer: Medicare Other | Source: Ambulatory Visit | Attending: Orthopedic Surgery | Admitting: Orthopedic Surgery

## 2020-04-09 DIAGNOSIS — Z01812 Encounter for preprocedural laboratory examination: Secondary | ICD-10-CM | POA: Insufficient documentation

## 2020-04-09 DIAGNOSIS — Z20822 Contact with and (suspected) exposure to covid-19: Secondary | ICD-10-CM | POA: Insufficient documentation

## 2020-04-09 LAB — SARS CORONAVIRUS 2 (TAT 6-24 HRS): SARS Coronavirus 2: NEGATIVE

## 2020-04-12 NOTE — Progress Notes (Signed)
Pt called with surgery time change

## 2020-04-13 ENCOUNTER — Ambulatory Visit (HOSPITAL_COMMUNITY)
Admission: RE | Admit: 2020-04-13 | Discharge: 2020-04-14 | Disposition: A | Discharge status: Home / Self Care | Payer: Medicare Other | Source: Ambulatory Visit | Attending: Orthopedic Surgery | Admitting: Orthopedic Surgery

## 2020-04-13 ENCOUNTER — Ambulatory Visit (HOSPITAL_COMMUNITY): Payer: Medicare Other | Admitting: Certified Registered Nurse Anesthetist

## 2020-04-13 ENCOUNTER — Ambulatory Visit (HOSPITAL_COMMUNITY): Payer: Medicare Other

## 2020-04-13 ENCOUNTER — Encounter (HOSPITAL_COMMUNITY): Payer: Self-pay | Admitting: Orthopedic Surgery

## 2020-04-13 ENCOUNTER — Ambulatory Visit (HOSPITAL_COMMUNITY): Payer: Medicare Other | Admitting: Physician Assistant

## 2020-04-13 ENCOUNTER — Encounter (HOSPITAL_COMMUNITY)
Admission: RE | Disposition: A | Discharge status: Home / Self Care | Payer: Self-pay | Source: Ambulatory Visit | Attending: Orthopedic Surgery

## 2020-04-13 DIAGNOSIS — M1612 Unilateral primary osteoarthritis, left hip: Secondary | ICD-10-CM | POA: Insufficient documentation

## 2020-04-13 DIAGNOSIS — C9480 Other specified leukemias not having achieved remission: Secondary | ICD-10-CM | POA: Insufficient documentation

## 2020-04-13 DIAGNOSIS — Z96649 Presence of unspecified artificial hip joint: Secondary | ICD-10-CM

## 2020-04-13 DIAGNOSIS — R002 Palpitations: Secondary | ICD-10-CM | POA: Diagnosis not present

## 2020-04-13 DIAGNOSIS — K449 Diaphragmatic hernia without obstruction or gangrene: Secondary | ICD-10-CM | POA: Insufficient documentation

## 2020-04-13 DIAGNOSIS — Z419 Encounter for procedure for purposes other than remedying health state, unspecified: Secondary | ICD-10-CM

## 2020-04-13 DIAGNOSIS — F410 Panic disorder [episodic paroxysmal anxiety] without agoraphobia: Secondary | ICD-10-CM | POA: Insufficient documentation

## 2020-04-13 DIAGNOSIS — Z7982 Long term (current) use of aspirin: Secondary | ICD-10-CM | POA: Diagnosis not present

## 2020-04-13 DIAGNOSIS — K219 Gastro-esophageal reflux disease without esophagitis: Secondary | ICD-10-CM | POA: Insufficient documentation

## 2020-04-13 DIAGNOSIS — Z880 Allergy status to penicillin: Secondary | ICD-10-CM | POA: Diagnosis not present

## 2020-04-13 DIAGNOSIS — Z823 Family history of stroke: Secondary | ICD-10-CM | POA: Diagnosis not present

## 2020-04-13 DIAGNOSIS — M169 Osteoarthritis of hip, unspecified: Secondary | ICD-10-CM | POA: Diagnosis present

## 2020-04-13 DIAGNOSIS — E785 Hyperlipidemia, unspecified: Secondary | ICD-10-CM | POA: Insufficient documentation

## 2020-04-13 DIAGNOSIS — Z8673 Personal history of transient ischemic attack (TIA), and cerebral infarction without residual deficits: Secondary | ICD-10-CM | POA: Diagnosis not present

## 2020-04-13 DIAGNOSIS — Z79899 Other long term (current) drug therapy: Secondary | ICD-10-CM | POA: Insufficient documentation

## 2020-04-13 DIAGNOSIS — I10 Essential (primary) hypertension: Secondary | ICD-10-CM | POA: Insufficient documentation

## 2020-04-13 DIAGNOSIS — Z882 Allergy status to sulfonamides status: Secondary | ICD-10-CM | POA: Insufficient documentation

## 2020-04-13 DIAGNOSIS — Z884 Allergy status to anesthetic agent status: Secondary | ICD-10-CM | POA: Diagnosis not present

## 2020-04-13 DIAGNOSIS — Z885 Allergy status to narcotic agent status: Secondary | ICD-10-CM | POA: Diagnosis not present

## 2020-04-13 DIAGNOSIS — Z8249 Family history of ischemic heart disease and other diseases of the circulatory system: Secondary | ICD-10-CM | POA: Insufficient documentation

## 2020-04-13 HISTORY — PX: TOTAL HIP ARTHROPLASTY: SHX124

## 2020-04-13 LAB — TYPE AND SCREEN
ABO/RH(D): O POS
Antibody Screen: NEGATIVE

## 2020-04-13 LAB — ABO/RH: ABO/RH(D): O POS

## 2020-04-13 SURGERY — ARTHROPLASTY, HIP, TOTAL, ANTERIOR APPROACH
Anesthesia: Spinal | Site: Hip | Laterality: Left

## 2020-04-13 MED ORDER — PHENYLEPHRINE HCL (PRESSORS) 10 MG/ML IV SOLN
INTRAVENOUS | Status: AC
  Filled 2020-04-13: qty 1

## 2020-04-13 MED ORDER — TRAMADOL HCL 50 MG PO TABS
50.0000 mg | ORAL_TABLET | Freq: Four times a day (QID) | ORAL | Status: DC | PRN
  Administered 2020-04-13: 50 mg via ORAL
  Filled 2020-04-13 (×2): qty 1

## 2020-04-13 MED ORDER — LACTATED RINGERS IV SOLN: INTRAVENOUS | Status: DC

## 2020-04-13 MED ORDER — ZINC SULFATE 220 (50 ZN) MG PO CAPS
220.0000 mg | ORAL_CAPSULE | Freq: Every day | ORAL | Status: DC
  Administered 2020-04-14: 220 mg via ORAL
  Filled 2020-04-13 (×2): qty 1

## 2020-04-13 MED ORDER — SODIUM CHLORIDE 0.9 % IV SOLN: INTRAVENOUS | Status: DC

## 2020-04-13 MED ORDER — POVIDONE-IODINE 10 % EX SWAB
2.0000 "application " | Freq: Once | CUTANEOUS | Status: AC
  Administered 2020-04-13: 2 via TOPICAL

## 2020-04-13 MED ORDER — ONDANSETRON HCL 4 MG/2ML IJ SOLN: 4.0000 mg | Freq: Four times a day (QID) | INTRAMUSCULAR | Status: DC | PRN

## 2020-04-13 MED ORDER — CHLORHEXIDINE GLUCONATE 0.12 % MT SOLN
15.0000 mL | Freq: Once | OROMUCOSAL | Status: AC
  Administered 2020-04-13: 15 mL via OROMUCOSAL

## 2020-04-13 MED ORDER — DEXAMETHASONE SODIUM PHOSPHATE 10 MG/ML IJ SOLN
INTRAMUSCULAR | Status: AC
  Filled 2020-04-13: qty 1

## 2020-04-13 MED ORDER — MENTHOL 3 MG MT LOZG: 1.0000 | LOZENGE | OROMUCOSAL | Status: DC | PRN

## 2020-04-13 MED ORDER — EPHEDRINE 5 MG/ML INJ
INTRAVENOUS | Status: AC
  Filled 2020-04-13: qty 10

## 2020-04-13 MED ORDER — APIXABAN 2.5 MG PO TABS
2.5000 mg | ORAL_TABLET | Freq: Two times a day (BID) | ORAL | Status: DC
  Administered 2020-04-14: 2.5 mg via ORAL
  Filled 2020-04-13: qty 1

## 2020-04-13 MED ORDER — CEFAZOLIN SODIUM-DEXTROSE 2-4 GM/100ML-% IV SOLN
2.0000 g | INTRAVENOUS | Status: AC
  Administered 2020-04-13: 2 g via INTRAVENOUS
  Filled 2020-04-13: qty 100

## 2020-04-13 MED ORDER — HYDROCHLOROTHIAZIDE 25 MG PO TABS
12.5000 mg | ORAL_TABLET | Freq: Every day | ORAL | Status: DC
  Administered 2020-04-14: 12.5 mg via ORAL
  Filled 2020-04-13: qty 1

## 2020-04-13 MED ORDER — PROPOFOL 500 MG/50ML IV EMUL
INTRAVENOUS | Status: DC | PRN
  Administered 2020-04-13: 50 ug/kg/min via INTRAVENOUS

## 2020-04-13 MED ORDER — ACETAMINOPHEN 325 MG PO TABS: 325.0000 mg | ORAL_TABLET | Freq: Four times a day (QID) | ORAL | Status: DC | PRN

## 2020-04-13 MED ORDER — FLEET ENEMA 7-19 GM/118ML RE ENEM: 1.0000 | ENEMA | Freq: Once | RECTAL | Status: DC | PRN

## 2020-04-13 MED ORDER — ONDANSETRON HCL 4 MG/2ML IJ SOLN
INTRAMUSCULAR | Status: AC
  Filled 2020-04-13: qty 2

## 2020-04-13 MED ORDER — ONDANSETRON HCL 4 MG/2ML IJ SOLN: 4.0000 mg | Freq: Once | INTRAMUSCULAR | Status: DC | PRN

## 2020-04-13 MED ORDER — METOCLOPRAMIDE HCL 5 MG PO TABS: 5.0000 mg | ORAL_TABLET | Freq: Three times a day (TID) | ORAL | Status: DC | PRN

## 2020-04-13 MED ORDER — DEXAMETHASONE SODIUM PHOSPHATE 10 MG/ML IJ SOLN
8.0000 mg | Freq: Once | INTRAMUSCULAR | Status: AC
  Administered 2020-04-13: 8 mg via INTRAVENOUS

## 2020-04-13 MED ORDER — PHENYLEPHRINE 40 MCG/ML (10ML) SYRINGE FOR IV PUSH (FOR BLOOD PRESSURE SUPPORT)
PREFILLED_SYRINGE | INTRAVENOUS | Status: AC
  Filled 2020-04-13: qty 10

## 2020-04-13 MED ORDER — ACETAMINOPHEN 10 MG/ML IV SOLN: 1000.0000 mg | Freq: Once | INTRAVENOUS | Status: DC | PRN

## 2020-04-13 MED ORDER — HYDROCODONE-ACETAMINOPHEN 5-325 MG PO TABS
1.0000 | ORAL_TABLET | ORAL | Status: DC | PRN
  Administered 2020-04-14 (×3): 1 via ORAL
  Filled 2020-04-13 (×3): qty 1

## 2020-04-13 MED ORDER — FENTANYL CITRATE (PF) 100 MCG/2ML IJ SOLN
INTRAMUSCULAR | Status: AC
  Filled 2020-04-13: qty 2

## 2020-04-13 MED ORDER — FENTANYL CITRATE (PF) 100 MCG/2ML IJ SOLN: 25.0000 ug | INTRAMUSCULAR | Status: DC | PRN

## 2020-04-13 MED ORDER — DIPHENHYDRAMINE HCL 12.5 MG/5ML PO ELIX: 12.5000 mg | ORAL_SOLUTION | ORAL | Status: DC | PRN

## 2020-04-13 MED ORDER — PHENYLEPHRINE 40 MCG/ML (10ML) SYRINGE FOR IV PUSH (FOR BLOOD PRESSURE SUPPORT)
PREFILLED_SYRINGE | INTRAVENOUS | Status: DC | PRN
  Administered 2020-04-13: 80 ug via INTRAVENOUS

## 2020-04-13 MED ORDER — PROPOFOL 1000 MG/100ML IV EMUL
INTRAVENOUS | Status: AC
  Filled 2020-04-13: qty 100

## 2020-04-13 MED ORDER — PROPOFOL 10 MG/ML IV BOLUS
INTRAVENOUS | Status: DC | PRN
  Administered 2020-04-13 (×2): 10 mg via INTRAVENOUS

## 2020-04-13 MED ORDER — DILTIAZEM HCL ER COATED BEADS 180 MG PO CP24
180.0000 mg | ORAL_CAPSULE | Freq: Every day | ORAL | Status: DC
  Administered 2020-04-14: 180 mg via ORAL
  Filled 2020-04-13: qty 1

## 2020-04-13 MED ORDER — LIDOCAINE 2% (20 MG/ML) 5 ML SYRINGE
INTRAMUSCULAR | Status: AC
  Filled 2020-04-13: qty 5

## 2020-04-13 MED ORDER — METOCLOPRAMIDE HCL 5 MG/ML IJ SOLN: 5.0000 mg | Freq: Three times a day (TID) | INTRAMUSCULAR | Status: DC | PRN

## 2020-04-13 MED ORDER — PHENOL 1.4 % MT LIQD: 1.0000 | OROMUCOSAL | Status: DC | PRN

## 2020-04-13 MED ORDER — WATER FOR IRRIGATION, STERILE IR SOLN
Status: DC | PRN
  Administered 2020-04-13: 2000 mL

## 2020-04-13 MED ORDER — ONDANSETRON HCL 4 MG/2ML IJ SOLN
INTRAMUSCULAR | Status: DC | PRN
  Administered 2020-04-13: 4 mg via INTRAVENOUS

## 2020-04-13 MED ORDER — METOPROLOL SUCCINATE ER 50 MG PO TB24
50.0000 mg | ORAL_TABLET | Freq: Every evening | ORAL | Status: DC
  Administered 2020-04-13: 50 mg via ORAL
  Filled 2020-04-13: qty 1

## 2020-04-13 MED ORDER — METHOCARBAMOL 500 MG IVPB - SIMPLE MED
500.0000 mg | Freq: Four times a day (QID) | INTRAVENOUS | Status: DC | PRN
  Filled 2020-04-13: qty 50

## 2020-04-13 MED ORDER — BUPIVACAINE IN DEXTROSE 0.75-8.25 % IT SOLN
INTRATHECAL | Status: DC | PRN
  Administered 2020-04-13: 1.4 mL via INTRATHECAL

## 2020-04-13 MED ORDER — ATORVASTATIN CALCIUM 40 MG PO TABS: 40.0000 mg | ORAL_TABLET | Freq: Every day | ORAL | Status: DC

## 2020-04-13 MED ORDER — TRANEXAMIC ACID-NACL 1000-0.7 MG/100ML-% IV SOLN
1000.0000 mg | INTRAVENOUS | Status: AC
  Administered 2020-04-13: 1000 mg via INTRAVENOUS
  Filled 2020-04-13: qty 100

## 2020-04-13 MED ORDER — CEFAZOLIN SODIUM-DEXTROSE 1-4 GM/50ML-% IV SOLN
1.0000 g | Freq: Four times a day (QID) | INTRAVENOUS | Status: AC
  Administered 2020-04-13 – 2020-04-14 (×2): 1 g via INTRAVENOUS
  Filled 2020-04-13 (×2): qty 50

## 2020-04-13 MED ORDER — BUPIVACAINE HCL 0.25 % IJ SOLN
INTRAMUSCULAR | Status: DC | PRN
  Administered 2020-04-13: 30 mL

## 2020-04-13 MED ORDER — DEXAMETHASONE SODIUM PHOSPHATE 10 MG/ML IJ SOLN
10.0000 mg | Freq: Once | INTRAMUSCULAR | Status: AC
  Administered 2020-04-14: 10 mg via INTRAVENOUS
  Filled 2020-04-13: qty 1

## 2020-04-13 MED ORDER — FENTANYL CITRATE (PF) 100 MCG/2ML IJ SOLN
INTRAMUSCULAR | Status: DC | PRN
  Administered 2020-04-13: 25 ug via INTRAVENOUS

## 2020-04-13 MED ORDER — POLYETHYLENE GLYCOL 3350 17 G PO PACK: 17.0000 g | PACK | Freq: Every day | ORAL | Status: DC | PRN

## 2020-04-13 MED ORDER — BUPIVACAINE HCL (PF) 0.25 % IJ SOLN
INTRAMUSCULAR | Status: AC
  Filled 2020-04-13: qty 30

## 2020-04-13 MED ORDER — METHOCARBAMOL 500 MG PO TABS
500.0000 mg | ORAL_TABLET | Freq: Four times a day (QID) | ORAL | Status: DC | PRN
  Administered 2020-04-13: 500 mg via ORAL
  Filled 2020-04-13: qty 1

## 2020-04-13 MED ORDER — BISACODYL 10 MG RE SUPP: 10.0000 mg | Freq: Every day | RECTAL | Status: DC | PRN

## 2020-04-13 MED ORDER — ONDANSETRON HCL 4 MG PO TABS: 4.0000 mg | ORAL_TABLET | Freq: Four times a day (QID) | ORAL | Status: DC | PRN

## 2020-04-13 MED ORDER — PHENYLEPHRINE HCL-NACL 10-0.9 MG/250ML-% IV SOLN
INTRAVENOUS | Status: DC | PRN
  Administered 2020-04-13: 40 ug/min via INTRAVENOUS

## 2020-04-13 MED ORDER — 0.9 % SODIUM CHLORIDE (POUR BTL) OPTIME
TOPICAL | Status: DC | PRN
  Administered 2020-04-13: 1000 mL

## 2020-04-13 MED ORDER — ORAL CARE MOUTH RINSE: 15.0000 mL | Freq: Once | OROMUCOSAL | Status: AC

## 2020-04-13 MED ORDER — DOCUSATE SODIUM 100 MG PO CAPS
100.0000 mg | ORAL_CAPSULE | Freq: Two times a day (BID) | ORAL | Status: DC
  Administered 2020-04-13 – 2020-04-14 (×2): 100 mg via ORAL
  Filled 2020-04-13 (×2): qty 1

## 2020-04-13 MED ORDER — ACETAMINOPHEN 10 MG/ML IV SOLN
1000.0000 mg | Freq: Four times a day (QID) | INTRAVENOUS | Status: DC
  Administered 2020-04-13: 1000 mg via INTRAVENOUS
  Filled 2020-04-13: qty 100

## 2020-04-13 SURGICAL SUPPLY — 44 items
BAG DECANTER FOR FLEXI CONT (MISCELLANEOUS) IMPLANT
BAG ZIPLOCK 12X15 (MISCELLANEOUS) IMPLANT
BLADE SAG 18X100X1.27 (BLADE) ×3 IMPLANT
CLOSURE WOUND 1/2 X4 (GAUZE/BANDAGES/DRESSINGS) ×1
COVER PERINEAL POST (MISCELLANEOUS) ×3 IMPLANT
COVER SURGICAL LIGHT HANDLE (MISCELLANEOUS) ×3 IMPLANT
COVER WAND RF STERILE (DRAPES) IMPLANT
CUP ACETBLR 48 OD SECTOR II (Hips) ×3 IMPLANT
DECANTER SPIKE VIAL GLASS SM (MISCELLANEOUS) ×3 IMPLANT
DRAPE STERI IOBAN 125X83 (DRAPES) ×3 IMPLANT
DRAPE U-SHAPE 47X51 STRL (DRAPES) ×6 IMPLANT
DRSG ADAPTIC 3X8 NADH LF (GAUZE/BANDAGES/DRESSINGS) ×3 IMPLANT
DRSG AQUACEL AG ADV 3.5X10 (GAUZE/BANDAGES/DRESSINGS) ×3 IMPLANT
DURAPREP 26ML APPLICATOR (WOUND CARE) ×3 IMPLANT
ELECT REM PT RETURN 15FT ADLT (MISCELLANEOUS) ×3 IMPLANT
EVACUATOR 1/8 PVC DRAIN (DRAIN) IMPLANT
GLOVE BIO SURGEON STRL SZ 6 (GLOVE) IMPLANT
GLOVE BIO SURGEON STRL SZ7 (GLOVE) IMPLANT
GLOVE BIO SURGEON STRL SZ8 (GLOVE) ×3 IMPLANT
GLOVE BIOGEL PI IND STRL 6.5 (GLOVE) IMPLANT
GLOVE BIOGEL PI IND STRL 7.0 (GLOVE) IMPLANT
GLOVE BIOGEL PI IND STRL 8 (GLOVE) ×1 IMPLANT
GLOVE BIOGEL PI INDICATOR 6.5 (GLOVE)
GLOVE BIOGEL PI INDICATOR 7.0 (GLOVE)
GLOVE BIOGEL PI INDICATOR 8 (GLOVE) ×2
GOWN STRL REUS W/TWL LRG LVL3 (GOWN DISPOSABLE) ×3 IMPLANT
GOWN STRL REUS W/TWL XL LVL3 (GOWN DISPOSABLE) IMPLANT
HEAD FEM STD 28X+1.5 STRL (Hips) ×3 IMPLANT
HOLDER FOLEY CATH W/STRAP (MISCELLANEOUS) ×3 IMPLANT
KIT TURNOVER KIT A (KITS) IMPLANT
LINER MARATHON 28 48 (Hips) ×3 IMPLANT
MANIFOLD NEPTUNE II (INSTRUMENTS) ×3 IMPLANT
PACK ANTERIOR HIP CUSTOM (KITS) ×3 IMPLANT
PENCIL SMOKE EVACUATOR COATED (MISCELLANEOUS) ×3 IMPLANT
STEM FEM ACTIS STD SZ4 (Stem) ×3 IMPLANT
STRIP CLOSURE SKIN 1/2X4 (GAUZE/BANDAGES/DRESSINGS) ×2 IMPLANT
SUT ETHIBOND NAB CT1 #1 30IN (SUTURE) ×3 IMPLANT
SUT MNCRL AB 4-0 PS2 18 (SUTURE) ×3 IMPLANT
SUT STRATAFIX 0 PDS 27 VIOLET (SUTURE) ×3
SUT VIC AB 2-0 CT1 27 (SUTURE) ×4
SUT VIC AB 2-0 CT1 TAPERPNT 27 (SUTURE) ×2 IMPLANT
SUTURE STRATFX 0 PDS 27 VIOLET (SUTURE) ×1 IMPLANT
SYR 50ML LL SCALE MARK (SYRINGE) IMPLANT
TRAY FOLEY MTR SLVR 16FR STAT (SET/KITS/TRAYS/PACK) ×3 IMPLANT

## 2020-04-13 NOTE — Anesthesia Procedure Notes (Signed)
Procedure Name: MAC Date/Time: 04/13/2020 11:23 AM Performed by: Maxwell Caul, CRNA Pre-anesthesia Checklist: Patient identified, Emergency Drugs available, Suction available and Patient being monitored Oxygen Delivery Method: Simple face mask

## 2020-04-13 NOTE — Transfer of Care (Signed)
Immediate Anesthesia Transfer of Care Note  Patient: Kelli Lucas  Procedure(s) Performed: TOTAL HIP ARTHROPLASTY ANTERIOR APPROACH (Left Hip)  Patient Location: PACU  Anesthesia Type:Spinal  Level of Consciousness: awake, alert  and oriented  Airway & Oxygen Therapy: Patient Spontanous Breathing and Patient connected to face mask oxygen  Post-op Assessment: Report given to RN and Post -op Vital signs reviewed and stable  Post vital signs: Reviewed and stable  Last Vitals:  Vitals Value Taken Time  BP 127/59 04/13/20 1255  Temp    Pulse 56 04/13/20 1257  Resp 10 04/13/20 1257  SpO2 100 % 04/13/20 1257  Vitals shown include unvalidated device data.  Last Pain:  Vitals:   04/13/20 1024  TempSrc:   PainSc: 0-No pain      Patients Stated Pain Goal: 3 (70/96/43 8381)  Complications: No complications documented.

## 2020-04-13 NOTE — Evaluation (Signed)
Physical Therapy Evaluation Patient Details Name: Kelli Lucas MRN: [id number] DOB: 11-12-1929 Today's Date: 04/13/2020   History of Present Illness  Patient is 86 y.o. female s/p Lt THA anterior approach on 04/13/20 with PMH significant for stroke, leukemia, HTN, HLD, GERD, OA, angina.     Clinical Impression  Kelli Lucas is a 86 y.o. female POD 0 s/p Lt THA. Patient reports independence with mobility at baseline. Patient is now limited by functional impairments (see PT problem list below) and requires min guard/supervision for transfers and gait with RW. Patient was able to ambulate 100 feet with RW and min guard assist. Patient instructed in exercise to facilitate ROM and circulation. Patient will benefit from continued skilled PT interventions to address impairments and progress towards PLOF. Acute PT will follow to progress mobility and stair training in preparation for safe discharge home.     Follow Up Recommendations Follow surgeon's recommendation for DC plan and follow-up therapies    Equipment Recommendations  Rolling walker with 5" wheels;3in1 (PT) (youth walker)    Recommendations for Other Services       Precautions / Restrictions Precautions Precautions: Fall Restrictions Weight Bearing Restrictions: No Other Position/Activity Restrictions: wbat      Mobility  Bed Mobility Overal bed mobility: Needs Assistance Bed Mobility: Supine to Sit     Supine to sit: Min guard;HOB elevated     General bed mobility comments: cues to use bed rail, no assist required, pt needs extra time and HOB elevated.   Transfers Overall transfer level: Needs assistance Equipment used: Rolling walker (2 wheeled) Transfers: Sit to/from Stand Sit to Stand: Min guard         General transfer comment: no assist required for power up and pt steady in standing, cues for hand placement/technique.  Ambulation/Gait Ambulation/Gait assistance: Min guard Gait Distance (Feet): 100  Feet Assistive device: Rolling walker (2 wheeled) Gait Pattern/deviations: Step-to pattern;Decreased stride length Gait velocity: decr   General Gait Details: cues for proximity to RW and step pattern. no overt LOB.  Stairs            Wheelchair Mobility    Modified Rankin (Stroke Patients Only)       Balance Overall balance assessment: Needs assistance;Mild deficits observed, not formally tested Sitting-balance support: Feet supported Sitting balance-Leahy Scale: Good     Standing balance support: During functional activity;Bilateral upper extremity supported Standing balance-Leahy Scale: Fair                               Pertinent Vitals/Pain Pain Assessment: 0-10 Pain Score: 3  Pain Location: Lt hip Pain Descriptors / Indicators: Sore;Discomfort;Aching Pain Intervention(s): Limited activity within patient's tolerance;Monitored during session;Repositioned    Home Living Family/patient expects to be discharged to:: Private residence Living Arrangements: Spouse/significant other Available Help at Discharge: Family Type of Home: House Home Access: Stairs to enter Entrance Stairs-Rails: None Entrance Stairs-Number of Steps: 3-4 Home Layout: Two level;Able to live on main level with bedroom/bathroom;Full bath on main level Home Equipment: Walker - 2 wheels;Cane - single point;Hand held shower head Additional Comments: pt's duaghter is available to help her for 1 week and then intermittently.    Prior Function Level of Independence: Independent               Hand Dominance   Dominant Hand: Right    Extremity/Trunk Assessment   Upper Extremity Assessment Upper Extremity Assessment: Overall WFL for tasks assessed  Lower Extremity Assessment Lower Extremity Assessment: Overall WFL for tasks assessed    Cervical / Trunk Assessment Cervical / Trunk Assessment: Normal  Communication   Communication: No difficulties  Cognition  Arousal/Alertness: Awake/alert Behavior During Therapy: WFL for tasks assessed/performed Overall Cognitive Status: Within Functional Limits for tasks assessed                                        General Comments      Exercises Total Joint Exercises Ankle Circles/Pumps: AROM;Both;15 reps;Seated Quad Sets: AROM;Left;5 reps;Seated Heel Slides: AROM;Left;5 reps;Seated   Assessment/Plan    PT Assessment Patient needs continued PT services  PT Problem List Decreased range of motion;Decreased strength;Decreased activity tolerance;Decreased balance;Decreased mobility;Decreased knowledge of use of DME;Decreased knowledge of precautions       PT Treatment Interventions DME instruction;Gait training;Stair training;Functional mobility training;Therapeutic activities;Therapeutic exercise;Balance training;Patient/family education    PT Goals (Current goals can be found in the Care Plan section)  Acute Rehab PT Goals Patient Stated Goal: be able to walk a mile a day PT Goal Formulation: With patient Time For Goal Achievement: 04/20/20 Potential to Achieve Goals: Good    Frequency 7X/week   Barriers to discharge        Co-evaluation               AM-PAC PT "6 Clicks" Mobility  Outcome Measure Help needed turning from your back to your side while in a flat bed without using bedrails?: None Help needed moving from lying on your back to sitting on the side of a flat bed without using bedrails?: A Little Help needed moving to and from a bed to a chair (including a wheelchair)?: A Little Help needed standing up from a chair using your arms (e.g., wheelchair or bedside chair)?: A Little Help needed to walk in hospital room?: A Little Help needed climbing 3-5 steps with a railing? : A Little 6 Click Score: 19    End of Session Equipment Utilized During Treatment: Gait belt Activity Tolerance: Patient tolerated treatment well Patient left: in chair;with call  bell/phone within reach;with chair alarm set;with family/visitor present Nurse Communication: Mobility status PT Visit Diagnosis: Muscle weakness (generalized) (M62.81);Difficulty in walking, not elsewhere classified (R26.2)    Time: 8937-3428 PT Time Calculation (min) (ACUTE ONLY): 27 min   Charges:   PT Evaluation $PT Eval Low Complexity: 1 Low PT Treatments $Gait Training: 8-22 mins        Verner Mould, DPT Acute Rehabilitation Services  Office [phone] Pager [phone]  04/13/2020 6:29 PM

## 2020-04-13 NOTE — Interval H&P Note (Signed)
History and Physical Interval Note:  04/13/2020 10:30 AM  Kelli Lucas  has presented today for surgery, with the diagnosis of left hip osteoarthritis.  The various methods of treatment have been discussed with the patient and family. After consideration of risks, benefits and other options for treatment, the patient has consented to  Procedure(s) with comments: [place] (Left) - 177min as a surgical intervention.  The patient's history has been reviewed, patient examined, no change in status, stable for surgery.  I have reviewed the patient's chart and labs.  Questions were answered to the patient's satisfaction.     [name]

## 2020-04-13 NOTE — Anesthesia Procedure Notes (Signed)
Spinal  Patient location during procedure: OR Start time: 04/13/2020 11:33 AM End time: 04/13/2020 11:36 AM Staffing Performed: anesthesiologist  Anesthesiologist: Lyn Hollingshead, MD Preanesthetic Checklist Completed: patient identified, IV checked, site marked, risks and benefits discussed, surgical consent, monitors and equipment checked, pre-op evaluation and timeout performed Spinal Block Patient position: sitting Prep: DuraPrep and site prepped and draped Patient monitoring: continuous pulse ox and blood pressure Approach: midline Location: L3-4 Injection technique: single-shot Needle Needle type: Pencan  Needle gauge: 24 G Needle length: 10 cm Needle insertion depth: 6 cm Assessment Sensory level: T10

## 2020-04-13 NOTE — Op Note (Signed)
OPERATIVE REPORT- TOTAL HIP ARTHROPLASTY   PREOPERATIVE DIAGNOSIS: Osteoarthritis of the Left hip.   POSTOPERATIVE DIAGNOSIS: Osteoarthritis of the Left  hip.   PROCEDURE: Left total hip arthroplasty, anterior approach.   SURGEON: Gaynelle Arabian, MD   ASSISTANT: Griffith Citron, PA-C  ANESTHESIA:  Spinal  ESTIMATED BLOOD LOSS:-450 mL    DRAINS: Hemovac x1.   COMPLICATIONS: None   CONDITION: PACU - hemodynamically stable.   BRIEF CLINICAL NOTE: Kelli Lucas is a 86 y.o. female who has advanced end-  stage arthritis of their Left  hip with progressively worsening pain and  dysfunction.The patient has failed nonoperative management and presents for  total hip arthroplasty.   PROCEDURE IN DETAIL: After successful administration of spinal  anesthetic, the traction boots for the [facility] bed were placed on both  feet and the patient was placed onto the [facility] bed, boots placed into the leg  holders. The Left hip was then isolated from the perineum with plastic  drapes and prepped and draped in the usual sterile fashion. ASIS and  greater trochanter were marked and a oblique incision was made, starting  at about 1 cm lateral and 2 cm distal to the ASIS and coursing towards  the anterior cortex of the femur. The skin was cut with a 10 blade  through subcutaneous tissue to the level of the fascia overlying the  tensor fascia lata muscle. The fascia was then incised in line with the  incision at the junction of the anterior third and posterior 2/3rd. The  muscle was teased off the fascia and then the interval between the TFL  and the rectus was developed. The Hohmann retractor was then placed at  the top of the femoral neck over the capsule. The vessels overlying the  capsule were cauterized and the fat on top of the capsule was removed.  A Hohmann retractor was then placed anterior underneath the rectus  femoris to give exposure to the entire anterior capsule. A T-shaped   capsulotomy was performed. The edges were tagged and the femoral head  was identified.       Osteophytes are removed off the superior acetabulum.  The femoral neck was then cut in situ with an oscillating saw. Traction  was then applied to the left lower extremity utilizing the [facility]  traction. The femoral head was then removed. Retractors were placed  around the acetabulum and then circumferential removal of the labrum was  performed. Osteophytes were also removed. Reaming starts at 45 mm to  medialize and  Increased in 2 mm increments to 47 mm. We reamed in  approximately 40 degrees of abduction, 20 degrees anteversion. A 48 mm  pinnacle acetabular shell was then impacted in anatomic position under  fluoroscopic guidance with excellent purchase. We did not need to place  any additional dome screws. A 28 mm neutral + 4 marathon liner was then  placed into the acetabular shell.       The femoral lift was then placed along the lateral aspect of the femur  just distal to the vastus ridge. The leg was  externally rotated and capsule  was stripped off the inferior aspect of the femoral neck down to the  level of the lesser trochanter, this was done with electrocautery. The femur was lifted after this was performed. The  leg was then placed in an extended and adducted position essentially delivering the femur. We also removed the capsule superiorly and the piriformis from the piriformis fossa  to gain excellent exposure of the  proximal femur. Rongeur was used to remove some cancellous bone to get  into the lateral portion of the proximal femur for placement of the  initial starter reamer. The starter broaches was placed  the starter broach  and was shown to go down the center of the canal. Broaching  with the Actis system was then performed starting at size 0  coursing  Up to size 4. A size 4 had excellent torsional and rotational  and axial stability. The trial standard offset neck was then  placed  with a 28 + 1.5 trial head. The hip was then reduced. We confirmed that  the stem was in the canal both on AP and lateral x-rays. It also has excellent sizing. The hip was reduced with outstanding stability through full extension and full external rotation.. AP pelvis was taken and the leg lengths were measured and found to be equal. Hip was then dislocated again and the femoral head and neck removed. The  femoral broach was removed. Size 4 Actis stem with a standard offset  neck was then impacted into the femur following native anteversion. Has  excellent purchase in the canal. Excellent torsional and rotational and  axial stability. It is confirmed to be in the canal on AP and lateral  fluoroscopic views. The 28 + 1.5 metal head was placed and the hip  reduced with outstanding stability. Again AP pelvis was taken and it  confirmed that the leg lengths were equal. The wound was then copiously  irrigated with saline solution and the capsule reattached and repaired  with Ethibond suture. 30 ml of .25% Bupivicaine was  injected into the capsule and into the edge of the tensor fascia lata as well as subcutaneous tissue. The fascia overlying the tensor fascia lata was then closed with a running #1 V-Loc. Subcu was closed with interrupted 2-0 Vicryl and subcuticular running 4-0 Monocryl. Incision was cleaned  and dried. Steri-Strips and a bulky sterile dressing applied. Hemovac  drain was hooked to suction and then the patient was awakened and transported to  recovery in stable condition.        Please note that a surgical assistant was a medical necessity for this procedure to perform it in a safe and expeditious manner. Assistant was necessary to provide appropriate retraction of vital neurovascular structures and to prevent femoral fracture and allow for anatomic placement of the prosthesis.  Gaynelle Arabian, M.D.

## 2020-04-14 ENCOUNTER — Encounter (HOSPITAL_COMMUNITY): Payer: Self-pay | Admitting: Orthopedic Surgery

## 2020-04-14 DIAGNOSIS — M1612 Unilateral primary osteoarthritis, left hip: Secondary | ICD-10-CM | POA: Diagnosis not present

## 2020-04-14 LAB — CBC
HCT: 32.9 % — ABNORMAL LOW (ref 36.0–46.0)
Hemoglobin: 10.8 g/dL — ABNORMAL LOW (ref 12.0–15.0)
MCH: 30.7 pg (ref 26.0–34.0)
MCHC: 32.8 g/dL (ref 30.0–36.0)
MCV: 93.5 fL (ref 80.0–100.0)
Platelets: 182 10*3/uL (ref 150–400)
RBC: 3.52 MIL/uL — ABNORMAL LOW (ref 3.87–5.11)
RDW: 13 % (ref 11.5–15.5)
WBC: 27.4 10*3/uL — ABNORMAL HIGH (ref 4.0–10.5)
nRBC: 0 % (ref 0.0–0.2)

## 2020-04-14 LAB — BASIC METABOLIC PANEL
Anion gap: 7 (ref 5–15)
BUN: 11 mg/dL (ref 8–23)
CO2: 25 mmol/L (ref 22–32)
Calcium: 8.9 mg/dL (ref 8.9–10.3)
Chloride: 100 mmol/L (ref 98–111)
Creatinine, Ser: 0.63 mg/dL (ref 0.44–1.00)
GFR calc non Af Amer: >60 mL/min (ref >60)
Glucose, Bld: 155 mg/dL — ABNORMAL HIGH (ref 70–99)
Potassium: 3.7 mmol/L (ref 3.5–5.1)
Sodium: 132 mmol/L — ABNORMAL LOW (ref 135–145)

## 2020-04-14 MED ORDER — HYDROCODONE-ACETAMINOPHEN 5-325 MG PO TABS: 1.0000 | ORAL_TABLET | Freq: Four times a day (QID) | ORAL | 0 refills | Status: DC | PRN

## 2020-04-14 MED ORDER — APIXABAN 2.5 MG PO TABS: 2.5000 mg | ORAL_TABLET | Freq: Two times a day (BID) | ORAL | 0 refills | Status: DC

## 2020-04-14 MED ORDER — METHOCARBAMOL 500 MG PO TABS: 500.0000 mg | ORAL_TABLET | Freq: Four times a day (QID) | ORAL | 0 refills | Status: DC | PRN

## 2020-04-14 MED ORDER — TRAMADOL HCL 50 MG PO TABS: 50.0000 mg | ORAL_TABLET | Freq: Four times a day (QID) | ORAL | 0 refills | Status: DC | PRN

## 2020-04-14 NOTE — Progress Notes (Signed)
Physical Therapy Treatment Patient Details Name: Kelli Lucas MRN: [id number] DOB: 1929-09-17 Today's Date: 04/14/2020    History of Present Illness Patient is 86 y.o. female s/p Lt THA anterior approach on 04/13/20 with PMH significant for stroke, leukemia, HTN, HLD, GERD, OA, angina.     PT Comments    Pt continues motivated and progressing well with mobility.  Pt up to ambulate in hall and negotiate stairs.  Dtr present for session and also reviewed on car transfers.   Follow Up Recommendations  Follow surgeon's recommendation for DC plan and follow-up therapies     Equipment Recommendations  Rolling walker with 5" wheels;3in1 (PT)    Recommendations for Other Services       Precautions / Restrictions Precautions Precautions: Fall Restrictions Weight Bearing Restrictions: No Other Position/Activity Restrictions: wbat    Mobility  Bed Mobility Overal bed mobility: Needs Assistance Bed Mobility: Sit to Supine       Sit to supine: Min guard   General bed mobility comments: cues for sequence and use of R LE to self assist  Transfers Overall transfer level: Needs assistance Equipment used: Rolling walker (2 wheeled) Transfers: Sit to/from Stand Sit to Stand: Min guard;Supervision         General transfer comment: cues for LE management and use of UEs to self assist  Ambulation/Gait Ambulation/Gait assistance: Min guard;Supervision Gait Distance (Feet): 140 Feet Assistive device: Rolling walker (2 wheeled) Gait Pattern/deviations: Step-to pattern;Step-through pattern;Decreased step length - right;Decreased step length - left;Shuffle;Trunk flexed Gait velocity: decr   General Gait Details: cues for posture, position from RW and initial sequenc   Stairs Stairs: Yes Stairs assistance: Min assist Stair Management: No rails;Step to pattern;Backwards;Forwards;With walker Number of Stairs: 5 General stair comments: single step twice fwd and once bkwd; 2 step  bkwd; cues for sequence and foot/RW placement   Wheelchair Mobility    Modified Rankin (Stroke Patients Only)       Balance Overall balance assessment: Needs assistance;Mild deficits observed, not formally tested Sitting-balance support: Feet supported Sitting balance-Leahy Scale: Good     Standing balance support: During functional activity;No upper extremity supported Standing balance-Leahy Scale: Fair                              Cognition Arousal/Alertness: Awake/alert Behavior During Therapy: WFL for tasks assessed/performed Overall Cognitive Status: Within Functional Limits for tasks assessed                                        Exercises      General Comments        Pertinent Vitals/Pain Pain Assessment: 0-10 Pain Score: 3  Pain Location: Lt hip Pain Descriptors / Indicators: Sore;Discomfort;Aching Pain Intervention(s): Limited activity within patient's tolerance;Monitored during session;Premedicated before session    Home Living                      Prior Function            PT Goals (current goals can now be found in the care plan section) Acute Rehab PT Goals Patient Stated Goal: be able to walk a mile a day PT Goal Formulation: With patient Time For Goal Achievement: 04/20/20 Potential to Achieve Goals: Good Progress towards PT goals: Progressing toward goals    Frequency    7X/week  PT Plan Current plan remains appropriate    Co-evaluation              AM-PAC PT "6 Clicks" Mobility   Outcome Measure  Help needed turning from your back to your side while in a flat bed without using bedrails?: None Help needed moving from lying on your back to sitting on the side of a flat bed without using bedrails?: A Little Help needed moving to and from a bed to a chair (including a wheelchair)?: A Little Help needed standing up from a chair using your arms (e.g., wheelchair or bedside chair)?: A  Little Help needed to walk in hospital room?: A Little Help needed climbing 3-5 steps with a railing? : A Little 6 Click Score: 19    End of Session Equipment Utilized During Treatment: Gait belt Activity Tolerance: Patient tolerated treatment well Patient left: in bed;with call bell/phone within reach;with family/visitor present Nurse Communication: Mobility status PT Visit Diagnosis: Muscle weakness (generalized) (M62.81);Difficulty in walking, not elsewhere classified (R26.2)     Time: 7972-8206 PT Time Calculation (min) (ACUTE ONLY): 29 min  Charges:  $Gait Training: 8-22 mins $Therapeutic Activity: 8-22 mins                     [name] PT Acute Rehabilitation Services Pager [phone] Office [phone]    Pascha Fogal 04/14/2020, 2:43 PM

## 2020-04-14 NOTE — Progress Notes (Signed)
Physical Therapy Treatment Patient Details Name: Kelli Lucas MRN: [id number] DOB: 22-Aug-1929 Today's Date: 04/14/2020    History of Present Illness Patient is 86 y.o. female s/p Lt THA anterior approach on 04/13/20 with PMH significant for stroke, leukemia, HTN, HLD, GERD, OA, angina.     PT Comments    Pt very motivated and progressing well with mobility.  Pt hopeful for dc home this pm.  Follow Up Recommendations  Follow surgeon's recommendation for DC plan and follow-up therapies     Equipment Recommendations  Rolling walker with 5" wheels;3in1 (PT)    Recommendations for Other Services       Precautions / Restrictions Precautions Precautions: Fall Restrictions Weight Bearing Restrictions: No Other Position/Activity Restrictions: wbat    Mobility  Bed Mobility               General bed mobility comments: Pt up in chair and requests back to same  Transfers Overall transfer level: Needs assistance Equipment used: Rolling walker (2 wheeled) Transfers: Sit to/from Stand Sit to Stand: Min guard         General transfer comment: no assist required for power up and pt steady in standing, cues for hand placement/technique.  Ambulation/Gait Ambulation/Gait assistance: Min guard Gait Distance (Feet): 200 Feet Assistive device: Rolling walker (2 wheeled) Gait Pattern/deviations: Step-to pattern;Step-through pattern;Decreased step length - right;Decreased step length - left;Shuffle;Trunk flexed Gait velocity: decr   General Gait Details: cues for posture, position from RW and initial sequenc   Stairs             Wheelchair Mobility    Modified Rankin (Stroke Patients Only)       Balance Overall balance assessment: Needs assistance;Mild deficits observed, not formally tested Sitting-balance support: Feet supported Sitting balance-Leahy Scale: Good     Standing balance support: During functional activity;Bilateral upper extremity  supported Standing balance-Leahy Scale: Fair                              Cognition Arousal/Alertness: Awake/alert Behavior During Therapy: WFL for tasks assessed/performed Overall Cognitive Status: Within Functional Limits for tasks assessed                                        Exercises Total Joint Exercises Ankle Circles/Pumps: AROM;Both;15 reps;Seated Quad Sets: AROM;Left;Seated;10 reps Heel Slides: AROM;Left;20 reps;Supine Hip ABduction/ADduction: AAROM;Left;15 reps;Supine    General Comments        Pertinent Vitals/Pain Pain Assessment: 0-10 Pain Score: 3  Pain Location: Lt hip Pain Descriptors / Indicators: Sore;Discomfort;Aching Pain Intervention(s): Limited activity within patient's tolerance;Monitored during session;Premedicated before session    Home Living                      Prior Function            PT Goals (current goals can now be found in the care plan section) Acute Rehab PT Goals Patient Stated Goal: be able to walk a mile a day PT Goal Formulation: With patient Time For Goal Achievement: 04/20/20 Potential to Achieve Goals: Good Progress towards PT goals: Progressing toward goals    Frequency    7X/week      PT Plan Current plan remains appropriate    Co-evaluation              AM-PAC PT "6 Clicks"  Mobility   Outcome Measure  Help needed turning from your back to your side while in a flat bed without using bedrails?: None Help needed moving from lying on your back to sitting on the side of a flat bed without using bedrails?: A Little Help needed moving to and from a bed to a chair (including a wheelchair)?: A Little Help needed standing up from a chair using your arms (e.g., wheelchair or bedside chair)?: A Little Help needed to walk in hospital room?: A Little Help needed climbing 3-5 steps with a railing? : A Little 6 Click Score: 19    End of Session Equipment Utilized During  Treatment: Gait belt Activity Tolerance: Patient tolerated treatment well Patient left: in chair;with call bell/phone within reach;with chair alarm set;with family/visitor present Nurse Communication: Mobility status PT Visit Diagnosis: Muscle weakness (generalized) (M62.81);Difficulty in walking, not elsewhere classified (R26.2)     Time: 2419-9144 PT Time Calculation (min) (ACUTE ONLY): 25 min  Charges:  $Gait Training: 8-22 mins $Therapeutic Exercise: 8-22 mins                     [name] PT Acute Rehabilitation Services Pager [phone] Office [phone]    Kelli Lucas 04/14/2020, 12:51 PM

## 2020-04-14 NOTE — Discharge Instructions (Addendum)
Information on my medicine - ELIQUIS (apixaban)  This medication education was reviewed with me or my healthcare representative as part of my discharge preparation.  The pharmacist that spoke with me during my hospital stay was:  Steva Colder, Student-PharmD  Why was Eliquis prescribed for you? Eliquis was prescribed for you to reduce the risk of blood clots forming after orthopedic surgery.    What do You need to know about Eliquis? Take your Eliquis TWICE DAILY - one tablet in the morning and one tablet in the evening with or without food.  It would be best to take the dose about the same time each day.  If you have difficulty swallowing the tablet whole please discuss with your pharmacist how to take the medication safely.  Take Eliquis exactly as prescribed by your doctor and DO NOT stop taking Eliquis without talking to the doctor who prescribed the medication.  Stopping without other medication to take the place of Eliquis may increase your risk of developing a clot.  After discharge, you should have regular check-up appointments with your healthcare provider that is prescribing your Eliquis.  What do you do if you miss a dose? If a dose of ELIQUIS is not taken at the scheduled time, take it as soon as possible on the same day and twice-daily administration should be resumed.  The dose should not be doubled to make up for a missed dose.  Do not take more than one tablet of ELIQUIS at the same time.  Important Safety Information A possible side effect of Eliquis is bleeding. You should call your healthcare provider right away if you experience any of the following: ? Bleeding from an injury or your nose that does not stop. ? Unusual colored urine (red or dark brown) or unusual colored stools (red or black). ? Unusual bruising for unknown reasons. ? A serious fall or if you hit your head (even if there is no bleeding).  Some medicines may interact with Eliquis and  might increase your risk of bleeding or clotting while on Eliquis. To help avoid this, consult your healthcare provider or pharmacist prior to using any new prescription or non-prescription medications, including herbals, vitamins, non-steroidal anti-inflammatory drugs (NSAIDs) and supplements.  This website has more information on Eliquis (apixaban): http://www.eliquis.com/eliquis/home

## 2020-04-14 NOTE — Plan of Care (Signed)
Pt ready to DC home with daughter. 

## 2020-04-14 NOTE — TOC Transition Note (Signed)
Transition of Care [facility]) - CM/SW Discharge Note   Patient Details  Name: Kelli Lucas MRN: [id number] Date of Birth: 01/05/1930  Transition of Care [facility]) CM/SW Contact:  Lennart Pall, LCSW Phone Number: 04/14/2020, 11:37 AM   Clinical Narrative:    Met with patient this morning and reviewing dc needs.  Plan for HEP.  Does need youth rolling walker - ordered via [employer].  No further TOC needs.   Final next level of care: Home/Self Care Barriers to Discharge: No Barriers Identified   Patient Goals and CMS Choice        Discharge Placement                       Discharge Plan and Services                DME Arranged: Walker youth DME Agency: [employer] Date DME Agency Contacted: 04/14/20 Time DME Agency Contacted: [id number] Representative spoke with at DME Agency: Freda Munro HH Arranged: NA [place] Agency: NA        Social Determinants of Health ([place]) Interventions     Readmission Risk Interventions No flowsheet data found.

## 2020-04-14 NOTE — Progress Notes (Signed)
   Subjective: 1 Day Post-Op Procedure(s) (LRB): TOTAL HIP ARTHROPLASTY ANTERIOR APPROACH (Left) Patient reports pain as mild.   Patient seen in rounds by Dr. Wynelle Link. Patient is well, and has had no acute complaints or problems other than discomfort in the left hip. No acute events overnight. Foley catheter removed, positive flatus. Ambulated 100 feet with PT yesterday. Denies CP, SHOB, N/V.  We will continue therapy today.   Objective: Vital signs in last 24 hours: Temp:  [97.6 F (36.4 C)-98.3 F (36.8 C)] 98 F (36.7 C) (10/07 0445) Pulse Rate:  [53-86] 65 (10/07 0445) Resp:  [9-18] 18 (10/07 0445) BP: (103-155)/(46-93) 132/93 (10/07 0445) SpO2:  [96 %-100 %] 97 % (10/07 0445)  Intake/Output from previous day:  Intake/Output Summary (Last 24 hours) at 04/14/2020 0724 Last data filed at 04/14/2020 0600 Gross per 24 hour  Intake 3745 ml  Output 3525 ml  Net 220 ml     Intake/Output this shift: No intake/output data recorded.  Labs: Recent Labs    04/14/20 0310  HGB 10.8*   Recent Labs    04/14/20 0310  WBC 27.4*  RBC 3.52*  HCT 32.9*  PLT 182   Recent Labs    04/14/20 0310  NA 132*  K 3.7  CL 100  CO2 25  BUN 11  CREATININE 0.63  GLUCOSE 155*  CALCIUM 8.9   No results for input(s): LABPT, INR in the last 72 hours.  Exam: General - Patient is Alert and Oriented Extremity - Neurologically intact Sensation intact distally Intact pulses distally Dorsiflexion/Plantar flexion intact Dressing - dressing C/D/I Motor Function - intact, moving foot and toes well on exam.   Past Medical History:  Diagnosis Date  . Anginal pain ([place])   . Arthritis    knees, hip  . Chronic lymphoid leukemia ([place])   . Complication of anesthesia   . Dysrhythmia    from teenager on  . GERD (gastroesophageal reflux disease)   . Headache    Hx  . Heart palpitations     lifelong symptoms  . History of hiatal hernia   . HLD (hyperlipidemia)    takes lipitor-last lipid  panel about 3 mo ago, and was good  . Hypertension   . Leukemia, chronic lymphoid ([place])    chronic lymphocytis Leukemia-MD Harlow Asa, [place] had this for about about 1 yr.   Is no t on any current therapy-is being watcvhed  . Panic attacks    Hx  . PONV (postoperative nausea and vomiting)   . Stroke [facility]) 2013    Assessment/Plan: 1 Day Post-Op Procedure(s) (LRB): TOTAL HIP ARTHROPLASTY ANTERIOR APPROACH (Left) Principal Problem:   OA (osteoarthritis) of hip  Estimated body mass index is 22.22 kg/m as calculated from the following:   Height as of 04/05/20: 4\' 11"  (1.499 m).   Weight as of 04/05/20: 49.9 kg. Advance diet Up with therapy D/C IV fluids  DVT Prophylaxis - Eliquis Weight bearing as tolerated.  Plan is to go Home after hospital stay. Plan for discharge today following 1-2 sessions of therapy as long as she continues to meet her goals. Follow up in the office in 2 weeks.   Griffith Citron, PA-C Orthopedic Surgery [phone] 04/14/2020, 7:24 AM

## 2020-04-14 NOTE — Progress Notes (Signed)
Physical Therapy Treatment Patient Details Name: Kelli Lucas MRN: [id number] DOB: November 02, 1929 Today's Date: 04/14/2020    History of Present Illness Patient is 86 y.o. female s/p Lt THA anterior approach on 04/13/20 with PMH significant for stroke, leukemia, HTN, HLD, GERD, OA, angina.     PT Comments    Pt performed supine and seated HEP program with assist and educated on progression to standing exercises.  Written instruction provided and reviewed with pt.  Dtr present for session.   Follow Up Recommendations  Follow surgeon's recommendation for DC plan and follow-up therapies     Equipment Recommendations  Rolling walker with 5" wheels;3in1 (PT)    Recommendations for Other Services       Precautions / Restrictions Precautions Precautions: Fall Restrictions Weight Bearing Restrictions: No Other Position/Activity Restrictions: wbat    Mobility  Bed Mobility Overal bed mobility: Needs Assistance Bed Mobility: Supine to Sit     Supine to sit: Min guard;Supervision Sit to supine: Min guard   General bed mobility comments: INcreased time with cues for sequence and use of R LE to self assist  Transfers Overall transfer level: Needs assistance Equipment used: Rolling walker (2 wheeled) Transfers: Sit to/from Stand Sit to Stand: Min guard;Supervision         General transfer comment: cues for LE management and use of UEs to self assist  Ambulation/Gait Ambulation/Gait assistance: Min guard;Supervision Gait Distance (Feet): 5 Feet Assistive device: Rolling walker (2 wheeled) Gait Pattern/deviations: Step-to pattern;Step-through pattern;Decreased step length - right;Decreased step length - left;Shuffle;Trunk flexed Gait velocity: decr   General Gait Details: cues for posture, position from RW; to move bed to chair only   Stairs Stairs: Yes Stairs assistance: Min assist Stair Management: No rails;Step to pattern;Backwards;Forwards;With walker Number of Stairs:  5 General stair comments: single step twice fwd and once bkwd; 2 step bkwd; cues for sequence and foot/RW placement   Wheelchair Mobility    Modified Rankin (Stroke Patients Only)       Balance Overall balance assessment: Needs assistance;Mild deficits observed, not formally tested Sitting-balance support: Feet supported Sitting balance-Leahy Scale: Good     Standing balance support: During functional activity;No upper extremity supported Standing balance-Leahy Scale: Fair                              Cognition Arousal/Alertness: Awake/alert Behavior During Therapy: WFL for tasks assessed/performed Overall Cognitive Status: Within Functional Limits for tasks assessed                                        Exercises Total Joint Exercises Ankle Circles/Pumps: AROM;Both;15 reps;Seated Quad Sets: AROM;Left;Seated;10 reps Heel Slides: AROM;Left;20 reps;Supine Hip ABduction/ADduction: AAROM;Left;15 reps;Supine Long Arc Quad: AROM;Left;15 reps;Seated    General Comments        Pertinent Vitals/Pain Pain Assessment: 0-10 Pain Score: 3  Pain Location: Lt hip Pain Descriptors / Indicators: Sore;Discomfort;Aching Pain Intervention(s): Limited activity within patient's tolerance;Monitored during session;Premedicated before session    Home Living                      Prior Function            PT Goals (current goals can now be found in the care plan section) Acute Rehab PT Goals Patient Stated Goal: be able to walk a mile a day PT  Goal Formulation: With patient Time For Goal Achievement: 04/20/20 Potential to Achieve Goals: Good Progress towards PT goals: Progressing toward goals    Frequency    7X/week      PT Plan Current plan remains appropriate    Co-evaluation              AM-PAC PT "6 Clicks" Mobility   Outcome Measure  Help needed turning from your back to your side while in a flat bed without using  bedrails?: None Help needed moving from lying on your back to sitting on the side of a flat bed without using bedrails?: A Little Help needed moving to and from a bed to a chair (including a wheelchair)?: A Little Help needed standing up from a chair using your arms (e.g., wheelchair or bedside chair)?: A Little Help needed to walk in hospital room?: A Little Help needed climbing 3-5 steps with a railing? : A Little 6 Click Score: 19    End of Session Equipment Utilized During Treatment: Gait belt Activity Tolerance: Patient tolerated treatment well Patient left: in chair;with call bell/phone within reach;with family/visitor present Nurse Communication: Mobility status PT Visit Diagnosis: Muscle weakness (generalized) (M62.81);Difficulty in walking, not elsewhere classified (R26.2)     Time: 1464-3142 PT Time Calculation (min) (ACUTE ONLY): 27 min  Charges:  $Gait Training: 8-22 mins $Therapeutic Exercise: 23-37 mins $Therapeutic Activity: 8-22 mins                     [name] PT Acute Rehabilitation Services Pager [phone] Office [phone]    Kelli Lucas 04/14/2020, 2:48 PM

## 2020-04-15 NOTE — Anesthesia Postprocedure Evaluation (Signed)
Anesthesia Post Note  Patient: Falana Clagg  Procedure(s) Performed: TOTAL HIP ARTHROPLASTY ANTERIOR APPROACH (Left Hip)     Patient location during evaluation: PACU Anesthesia Type: Spinal Level of consciousness: awake Pain management: pain level controlled Vital Signs Assessment: post-procedure vital signs reviewed and stable Respiratory status: spontaneous breathing Cardiovascular status: stable Postop Assessment: no headache, no backache, spinal receding, patient able to bend at knees and no apparent nausea or vomiting Anesthetic complications: no   No complications documented.  Last Vitals:  Vitals:   04/14/20 0954 04/14/20 1300  BP: (!) 127/50 (!) 125/55  Pulse: 69 75  Resp: 18 18  Temp: 36.4 C 37.1 C  SpO2: 98% 97%    Last Pain:  Vitals:   04/14/20 1322  TempSrc:   PainSc: 5    Pain Goal: Patients Stated Pain Goal: 3 (04/14/20 0200)                 Huston Foley

## 2020-05-16 ENCOUNTER — Ambulatory Visit (INDEPENDENT_AMBULATORY_CARE_PROVIDER_SITE_OTHER): Payer: Medicare Other | Admitting: Cardiovascular Disease

## 2020-05-16 ENCOUNTER — Encounter: Payer: Self-pay | Admitting: Cardiovascular Disease

## 2020-05-16 ENCOUNTER — Other Ambulatory Visit: Payer: Self-pay

## 2020-05-16 VITALS — BP 110/70 | HR 66 | Ht 60.0 in | Wt 109.4 lb

## 2020-05-16 DIAGNOSIS — E782 Mixed hyperlipidemia: Secondary | ICD-10-CM | POA: Diagnosis not present

## 2020-05-16 DIAGNOSIS — R002 Palpitations: Secondary | ICD-10-CM

## 2020-05-16 DIAGNOSIS — I10 Essential (primary) hypertension: Secondary | ICD-10-CM

## 2020-05-16 NOTE — Patient Instructions (Signed)

## 2020-05-16 NOTE — Progress Notes (Signed)
Cardiology Office Note:    Date:  05/17/2020   ID:  Kelli Lucas, DOB [birth year], MRN [id number]  PCP:  Christain Sacramento, MD  [facility] Cardiologist:  Sherren Mocha, MD  [facility] Electrophysiologist:  None   Referring MD: Christain Sacramento, MD   Chief Complaint  Patient presents with   Palpitations    History of Present Illness:    Kelli Lucas is a 86 y.o. female with a hx of chronic heart palpitations, hypertension, and mixed hyperlipidemia, presenting for cardiology follow-up evaluation.  The patient also has a history of stroke in 2013.  At that time she underwent transesophageal echo and event monitoring, both studies yielding no pertinent findings.  She is here with her husband today. She recently underwent left hip replacement and is now walking without her walker.  From a cardiac perspective she seems to be doing fine.  She did not have any cardiac-related problems around the time of surgery.  She has not had any recent problems with heart palpitations.  She denies shortness of breath, chest pain, lightheadedness, presyncope, orthopnea, or PND.  Past Medical History:  Diagnosis Date   Anginal pain ([place])    Arthritis    knees, hip   Chronic lymphoid leukemia ([facility])    Complication of anesthesia    Dysrhythmia    from teenager on   GERD (gastroesophageal reflux disease)    Headache    Hx   Heart palpitations     lifelong symptoms   History of hiatal hernia    HLD (hyperlipidemia)    takes lipitor-last lipid panel about 3 mo ago, and was good   Hypertension    Leukemia, chronic lymphoid ([place])    chronic lymphocytis Leukemia-MD Harlow Asa, [place]-has had this for about about 1 yr.   Is no t on any current therapy-is being watcvhed   Panic attacks    Hx   PONV (postoperative nausea and vomiting)    Stroke [facility]) 2013    Past Surgical History:  Procedure Laterality Date   BREAST LUMPECTOMY Left 1980s   lumpectomy   EYE SURGERY Bilateral 2019, [place]   TEE WITHOUT CARDIOVERSION  10/15/2011   Procedure: TRANSESOPHAGEAL ECHOCARDIOGRAM (TEE);  Surgeon: Fay Records, MD;  Location: [place];  Service: Cardiovascular;  Laterality: N/A;   TONSILLECTOMY     TOTAL HIP ARTHROPLASTY Left 04/13/2020   Procedure: TOTAL HIP ARTHROPLASTY ANTERIOR APPROACH;  Surgeon: Gaynelle Arabian, MD;  Location: [facility];  Service: Orthopedics;  Laterality: Left;  125min    Current Medications: Current Meds  Medication Sig   aspirin 325 MG tablet Take 325 mg by mouth daily.   atorvastatin (LIPITOR) 40 MG tablet Take 40 mg by mouth daily at 6 PM.    Calcium Carb-Cholecalciferol (CALCIUM 500/D PO) Take by mouth.   Cyanocobalamin (VITAMIN B-12) 5000 MCG TBDP Take by mouth.   diltiazem (CARDIZEM CD) 180 MG 24 hr capsule Take 180 mg by mouth daily.   hydrochlorothiazide (HYDRODIURIL) 12.5 MG tablet Take 12.5 mg by mouth daily.   metoprolol succinate (TOPROL-XL) 50 MG 24 hr tablet Take 50 mg by mouth every evening. Take with or immediately following a meal.      Allergies:   Phenobarbital, Penicillins, and Sulfa antibiotics   Social History   Socioeconomic History   Marital status: Married    Spouse name: Not on file   Number of children: Not on file   Years of education: Not on file  Highest education level: Not on file  Occupational History   Not on file  Tobacco Use   Smoking status: Never Smoker   Smokeless tobacco: Never Used  Vaping Use   Vaping Use: Never used  Substance and Sexual Activity   Alcohol use: No   Drug use: No   Sexual activity: Not on file  Other Topics Concern   Not on file  Social History Narrative   Worked with [employer]-workwed there and then married   Network engineer, office work-   Social Determinants of Radio broadcast assistant Strain:    Difficulty of Paying Living Expenses: Not on file  Food Insecurity:    Worried About Charity fundraiser in the Last Year: Not on file    [employer] of Food in the Last Year: Not on file  Transportation Needs:    Film/video editor (Medical): Not on file   Lack of Transportation (Non-Medical): Not on file  Physical Activity:    Days of Exercise per Week: Not on file   Minutes of Exercise per Session: Not on file  Stress:    Feeling of Stress : Not on file  Social Connections:    Frequency of Communication with Friends and Family: Not on file   Frequency of Social Gatherings with Friends and Family: Not on file   Attends Religious Services: Not on file   Active Member of Clubs or Organizations: Not on file   Attends Archivist Meetings: Not on file   Marital Status: Not on file     Family History: The patient's family history includes Coronary artery disease in her father; Heart failure in her mother; Stroke in her father.  ROS:   Please see the history of present illness.    All other systems reviewed and are negative.  EKGs/Labs/Other Studies Reviewed:    EKG:  EKG is not ordered today.    Recent Labs: 04/05/2020: ALT 15 04/14/2020: BUN 11; Creatinine, Ser 0.63; Hemoglobin 10.8; Platelets 182; Potassium 3.7; Sodium 132  Recent Lipid Panel    Component Value Date/Time   CHOL 154 10/13/2011 0500   TRIG 105 10/13/2011 0500   HDL 47 10/13/2011 0500   CHOLHDL 3.3 10/13/2011 0500   VLDL 21 10/13/2011 0500   LDLCALC 86 10/13/2011 0500     Risk Assessment/Calculations:       Physical Exam:    VS:  BP 110/70    Pulse 66    Ht 5' (1.524 m)    Wt 109 lb 6.4 oz (49.6 kg)    SpO2 99%    BMI 21.37 kg/m     Wt Readings from Last 3 Encounters:  05/16/20 109 lb 6.4 oz (49.6 kg)  04/05/20 110 lb (49.9 kg)  03/21/20 112 lb (50.8 kg)     GEN:  Well nourished, well developed pleasant elderly woman in no acute distress HEENT: Normal NECK: No JVD; No carotid bruits LYMPHATICS: No lymphadenopathy CARDIAC: RRR, no murmurs, rubs, gallops RESPIRATORY:  Clear to auscultation without rales,  wheezing or rhonchi  ABDOMEN: Soft, non-tender, non-distended MUSCULOSKELETAL:  No edema; No deformity  SKIN: Warm and dry NEUROLOGIC:  Alert and oriented x 3 PSYCHIATRIC:  Normal affect   ASSESSMENT:    1. Palpitations   2. Essential hypertension   3. Mixed hyperlipidemia    PLAN:    In order of problems listed above:  1. Stable on a combination of long-acting diltiazem and metoprolol succinate.  Doing very well at present.  Recommend follow-up in 1 year. 2. Blood pressure is well controlled on current medications.  This includes diltiazem, hydrochlorothiazide, and metoprolol succinate.  No changes recommended today. 3. Treated with atorvastatin 40 mg.  Lipids followed by primary care physician.  Recent labs reviewed with LDL cholesterol 77 mg/dL, HDL 45, ALT 12.    Shared Decision Making/Informed Consent      Medication Adjustments/Labs and Tests Ordered: Current medicines are reviewed at length with the patient today.  Concerns regarding medicines are outlined above.  No orders of the defined types were placed in this encounter.  No orders of the defined types were placed in this encounter.   Patient Instructions  Medication Instructions:  Your provider recommends that you continue on your current medications as directed. Please refer to the Current Medication list given to you today.   *If you need a refill on your cardiac medications before your next appointment, please call your pharmacy*   Follow-Up: At [facility], you and your health needs are our priority.  As part of our continuing mission to provide you with exceptional heart care, we have created designated Provider Care Teams.  These Care Teams include your primary Cardiologist (physician) and Advanced Practice Providers (APPs -  Physician Assistants and Nurse Practitioners) who all work together to provide you with the care you need, when you need it. Your next appointment:   12 month(s) The format for  your next appointment:   In Person Provider:   You may see Sherren Mocha, MD or one of the following Advanced Practice Providers on your designated Care Team:    Richardson Dopp, PA-C  Robbie Lis, [place]      Signed, Sherren Mocha, MD  05/17/2020 12:45 PM    [place]

## 2020-05-17 ENCOUNTER — Encounter: Payer: Self-pay | Admitting: Cardiovascular Disease

## 2021-02-16 ENCOUNTER — Emergency Department (HOSPITAL_COMMUNITY)
Admission: EM | Admit: 2021-02-16 | Discharge: 2021-02-17 | Disposition: A | Discharge status: Home / Self Care | Payer: Medicare Other | Attending: Emergency Medicine | Admitting: Emergency Medicine

## 2021-02-16 ENCOUNTER — Emergency Department (HOSPITAL_COMMUNITY): Payer: Medicare Other

## 2021-02-16 ENCOUNTER — Encounter (HOSPITAL_COMMUNITY): Payer: Self-pay

## 2021-02-16 ENCOUNTER — Other Ambulatory Visit: Payer: Self-pay

## 2021-02-16 DIAGNOSIS — Z7982 Long term (current) use of aspirin: Secondary | ICD-10-CM | POA: Diagnosis not present

## 2021-02-16 DIAGNOSIS — K219 Gastro-esophageal reflux disease without esophagitis: Secondary | ICD-10-CM | POA: Diagnosis not present

## 2021-02-16 DIAGNOSIS — K2900 Acute gastritis without bleeding: Secondary | ICD-10-CM | POA: Diagnosis not present

## 2021-02-16 DIAGNOSIS — I10 Essential (primary) hypertension: Secondary | ICD-10-CM | POA: Diagnosis not present

## 2021-02-16 DIAGNOSIS — R109 Unspecified abdominal pain: Secondary | ICD-10-CM

## 2021-02-16 DIAGNOSIS — Z7901 Long term (current) use of anticoagulants: Secondary | ICD-10-CM | POA: Insufficient documentation

## 2021-02-16 DIAGNOSIS — Z79899 Other long term (current) drug therapy: Secondary | ICD-10-CM | POA: Insufficient documentation

## 2021-02-16 DIAGNOSIS — Z96642 Presence of left artificial hip joint: Secondary | ICD-10-CM | POA: Diagnosis not present

## 2021-02-16 DIAGNOSIS — R1010 Upper abdominal pain, unspecified: Secondary | ICD-10-CM | POA: Diagnosis present

## 2021-02-16 LAB — COMPREHENSIVE METABOLIC PANEL
ALT: 19 U/L (ref 0–44)
AST: 26 U/L (ref 15–41)
Albumin: 4.5 g/dL (ref 3.5–5.0)
Alkaline Phosphatase: 71 U/L (ref 38–126)
Anion gap: 9 (ref 5–15)
BUN: 14 mg/dL (ref 8–23)
CO2: 26 mmol/L (ref 22–32)
Calcium: 10 mg/dL (ref 8.9–10.3)
Chloride: 97 mmol/L — ABNORMAL LOW (ref 98–111)
Creatinine, Ser: 0.73 mg/dL (ref 0.44–1.00)
GFR, Estimated: >60 mL/min (ref >60)
Glucose, Bld: 118 mg/dL — ABNORMAL HIGH (ref 70–99)
Potassium: 4 mmol/L (ref 3.5–5.1)
Sodium: 132 mmol/L — ABNORMAL LOW (ref 135–145)
Total Bilirubin: 1 mg/dL (ref 0.3–1.2)
Total Protein: 6.9 g/dL (ref 6.5–8.1)

## 2021-02-16 LAB — CBC WITH DIFFERENTIAL/PLATELET
Abs Immature Granulocytes: 0.05 10*3/uL (ref 0.00–0.07)
Basophils Absolute: 0.1 10*3/uL (ref 0.0–0.1)
Basophils Relative: 0 %
Eosinophils Absolute: 0.1 10*3/uL (ref 0.0–0.5)
Eosinophils Relative: 0 %
HCT: 42.9 % (ref 36.0–46.0)
Hemoglobin: 14.2 g/dL (ref 12.0–15.0)
Immature Granulocytes: 0 %
Lymphocytes Relative: 57 %
Lymphs Abs: 11.4 10*3/uL — ABNORMAL HIGH (ref 0.7–4.0)
MCH: 30.7 pg (ref 26.0–34.0)
MCHC: 33.1 g/dL (ref 30.0–36.0)
MCV: 92.7 fL (ref 80.0–100.0)
Monocytes Absolute: 2.4 10*3/uL — ABNORMAL HIGH (ref 0.1–1.0)
Monocytes Relative: 12 %
Neutro Abs: 6.3 10*3/uL (ref 1.7–7.7)
Neutrophils Relative %: 31 %
Platelets: 205 10*3/uL (ref 150–400)
RBC: 4.63 MIL/uL (ref 3.87–5.11)
RDW: 13.5 % (ref 11.5–15.5)
WBC: 20.4 10*3/uL — ABNORMAL HIGH (ref 4.0–10.5)
nRBC: 0 % (ref 0.0–0.2)

## 2021-02-16 LAB — URINALYSIS, ROUTINE W REFLEX MICROSCOPIC
Bacteria, UA: NONE SEEN
Bilirubin Urine: NEGATIVE
Glucose, UA: NEGATIVE mg/dL
Ketones, ur: 5 mg/dL — AB
Nitrite: NEGATIVE
Protein, ur: NEGATIVE mg/dL
Specific Gravity, Urine: 1.016 (ref 1.005–1.030)
pH: 6 (ref 5.0–8.0)

## 2021-02-16 LAB — LIPASE, BLOOD: Lipase: 38 U/L (ref 11–51)

## 2021-02-16 MED ORDER — IOHEXOL 350 MG/ML SOLN
80.0000 mL | Freq: Once | INTRAVENOUS | Status: AC | PRN
  Administered 2021-02-16: 80 mL via INTRAVENOUS

## 2021-02-16 MED ORDER — SODIUM CHLORIDE 0.9 % IV SOLN
1.0000 g | Freq: Once | INTRAVENOUS | Status: AC
  Administered 2021-02-16: 1 g via INTRAVENOUS
  Filled 2021-02-16: qty 10

## 2021-02-16 MED ORDER — CEFUROXIME AXETIL 250 MG PO TABS: 250.0000 mg | ORAL_TABLET | Freq: Two times a day (BID) | ORAL | 0 refills | Status: AC

## 2021-02-16 MED ORDER — METRONIDAZOLE 250 MG PO TABS: 250.0000 mg | ORAL_TABLET | Freq: Two times a day (BID) | ORAL | 0 refills | Status: AC

## 2021-02-16 MED ORDER — ALUM & MAG HYDROXIDE-SIMETH 200-200-20 MG/5ML PO SUSP
30.0000 mL | Freq: Once | ORAL | Status: AC
  Administered 2021-02-16: 30 mL via ORAL
  Filled 2021-02-16: qty 30

## 2021-02-16 MED ORDER — PANTOPRAZOLE SODIUM 20 MG PO TBEC: 20.0000 mg | DELAYED_RELEASE_TABLET | Freq: Every day | ORAL | 0 refills | Status: DC

## 2021-02-16 MED ORDER — PANTOPRAZOLE SODIUM 40 MG IV SOLR
40.0000 mg | Freq: Once | INTRAVENOUS | Status: AC
  Administered 2021-02-16: 40 mg via INTRAVENOUS
  Filled 2021-02-16: qty 40

## 2021-02-16 MED ORDER — ONDANSETRON HCL 4 MG/2ML IJ SOLN
4.0000 mg | Freq: Once | INTRAMUSCULAR | Status: AC
  Administered 2021-02-16: 4 mg via INTRAVENOUS
  Filled 2021-02-16: qty 2

## 2021-02-16 NOTE — ED Notes (Signed)
IV started. Daughter at bedside

## 2021-02-16 NOTE — ED Notes (Signed)
Patient ambulated to the bathroom.

## 2021-02-16 NOTE — Discharge Instructions (Addendum)
Take the antibiotics as prescribed.  Take the Protonix to help with the acid reflux.  Follow-up with your primary care doctor to discuss medications for your stress and anxiety.

## 2021-02-16 NOTE — ED Provider Notes (Signed)
[facility] DEPT Provider Note   CSN: [id number] Arrival date & time: 02/16/21  1534     History Chief Complaint  Patient presents with   Abdominal Pain    Kelli Lucas is a 87 y.o. female.   Abdominal Pain Associated symptoms: no dysuria     Pt states her husband passed out a couple of days ago.  Pt became very upset and was screaming.  Since then she has had pain in her upper abdomen.  She has been nauseated and feels like she has to vomit but she has not.  No diarrhea.  No urinary symptoms. She has to belch a lot. She has not taken anything for it.  Past Medical History:  Diagnosis Date   Anginal pain ([place])    Arthritis    knees, hip   Chronic lymphoid leukemia (HCC)    Complication of anesthesia    Dysrhythmia    from teenager on   GERD (gastroesophageal reflux disease)    Headache    Hx   Heart palpitations     lifelong symptoms   History of hiatal hernia    HLD (hyperlipidemia)    takes lipitor-last lipid panel about 3 mo ago, and was good   Hypertension    Leukemia, chronic lymphoid ([place])    chronic lymphocytis Leukemia-MD Harlow Asa, Jason-has had this for about about 1 yr.   Is no t on any current therapy-is being watcvhed   Panic attacks    Hx   PONV (postoperative nausea and vomiting)    Stroke [facility]) 2013    Patient Active Problem List   Diagnosis Date Noted   OA (osteoarthritis) of hip 04/13/2020   Preoperative clearance 08/19/2018   CVA-L frontal area 4/6 10/12/2011   HLD (hyperlipidemia)    Leukemia, chronic lymphoid ([place])    Hypertension     Past Surgical History:  Procedure Laterality Date   BREAST LUMPECTOMY Left 1980s   lumpectomy   EYE SURGERY Bilateral 2019, 2020   [place]   TEE WITHOUT CARDIOVERSION  10/15/2011   Procedure: TRANSESOPHAGEAL ECHOCARDIOGRAM (TEE);  Surgeon: Fay Records, MD;  Location: [facility];  Service: Cardiovascular;  Laterality: N/A;   TONSILLECTOMY     TOTAL HIP  ARTHROPLASTY Left 04/13/2020   Procedure: TOTAL HIP ARTHROPLASTY ANTERIOR APPROACH;  Surgeon: Gaynelle Arabian, MD;  Location: [facility];  Service: Orthopedics;  Laterality: Left;  113mn     OB History   No obstetric history on file.     Family History  Problem Relation Age of Onset   Heart failure Mother        liverd till 112  Stroke Father        LIVED TO 93YR OF AGE   Coronary artery disease Father        HEART ATTACK WRushIN 882'S   Social History   Tobacco Use   Smoking status: Never   Smokeless tobacco: Never  Vaping Use   Vaping Use: Never used  Substance Use Topics   Alcohol use: No   Drug use: No    Home Medications Prior to Admission medications   Medication Sig Start Date End Date Taking? Authorizing Provider  cefUROXime (CEFTIN) 250 MG tablet Take 1 tablet (250 mg total) by mouth 2 (two) times daily with a meal for 7 days. 02/16/21 02/23/21 Yes KDorie Rank MD  metroNIDAZOLE (FLAGYL) 250 MG tablet Take 1 tablet (250 mg total) by mouth 2 (two) times  daily for 7 days. 02/16/21 02/23/21 Yes Dorie Rank, MD  pantoprazole (PROTONIX) 20 MG tablet Take 1 tablet (20 mg total) by mouth daily. 02/16/21  Yes Dorie Rank, MD  apixaban (ELIQUIS) 2.5 MG TABS tablet Take 1 tablet (2.5 mg total) by mouth every 12 (twelve) hours for 20 days. Then resume home dose of Aspirin 325 mg daily. 04/14/20 05/04/20  Irving Copas, PA-C  aspirin 325 MG tablet Take 325 mg by mouth daily.    [provider]  atorvastatin (LIPITOR) 40 MG tablet Take 40 mg by mouth daily at 6 PM.     [provider]  Calcium Carb-Cholecalciferol (CALCIUM 500/D PO) Take by mouth.    [provider]  Cyanocobalamin (VITAMIN B-12) 5000 MCG TBDP Take by mouth.    [provider]  diltiazem (CARDIZEM CD) 180 MG 24 hr capsule Take 180 mg by mouth daily.    [provider]  hydrochlorothiazide (HYDRODIURIL) 12.5 MG tablet Take 12.5 mg by mouth daily.    [provider]   metoprolol succinate (TOPROL-XL) 50 MG 24 hr tablet Take 50 mg by mouth every evening. Take with or immediately following a meal.     [provider]    Allergies    Phenobarbital, Penicillins, and Sulfa antibiotics  Review of Systems   Review of Systems  Gastrointestinal:  Positive for abdominal pain.  Genitourinary:  Negative for dysuria and urgency.  All other systems reviewed and are negative.  Physical Exam Updated Vital Signs BP 139/63   Pulse 63   Temp 98.5 F (36.9 C)   Resp 10   Ht 1.524 m (5')   Wt 49 kg   SpO2 94%   BMI 21.10 kg/m   Physical Exam Vitals and nursing note reviewed.  Constitutional:      General: She is not in acute distress.    Appearance: She is well-developed.  HENT:     Head: Normocephalic and atraumatic.     Right Ear: External ear normal.     Left Ear: External ear normal.  Eyes:     General: No scleral icterus.       Right eye: No discharge.        Left eye: No discharge.     Conjunctiva/sclera: Conjunctivae normal.  Neck:     Trachea: No tracheal deviation.  Cardiovascular:     Rate and Rhythm: Normal rate and regular rhythm.  Pulmonary:     Effort: Pulmonary effort is normal. No respiratory distress.     Breath sounds: Normal breath sounds. No stridor. No wheezing or rales.  Abdominal:     General: Bowel sounds are normal. There is no distension.     Palpations: Abdomen is soft.     Tenderness: There is abdominal tenderness in the epigastric area. There is no guarding or rebound.  Musculoskeletal:        General: No tenderness or deformity.     Cervical back: Neck supple.  Skin:    General: Skin is warm and dry.     Findings: No rash.  Neurological:     General: No focal deficit present.     Mental Status: She is alert.     Cranial Nerves: No cranial nerve deficit (no facial droop, extraocular movements intact, no slurred speech).     Sensory: No sensory deficit.     Motor: No abnormal muscle tone or seizure  activity.     Coordination: Coordination normal.  Psychiatric:  Mood and Affect: Mood normal.    ED Results / Procedures / Treatments   Labs (all labs ordered are listed, but only abnormal results are displayed) Labs Reviewed  CBC WITH DIFFERENTIAL/PLATELET - Abnormal; Notable for the following components:      Result Value   WBC 20.4 (*)    Lymphs Abs 11.4 (*)    Monocytes Absolute 2.4 (*)    All other components within normal limits  COMPREHENSIVE METABOLIC PANEL - Abnormal; Notable for the following components:   Sodium 132 (*)    Chloride 97 (*)    Glucose, Bld 118 (*)    All other components within normal limits  URINALYSIS, ROUTINE W REFLEX MICROSCOPIC - Abnormal; Notable for the following components:   Hgb urine dipstick SMALL (*)    Ketones, ur 5 (*)    Leukocytes,Ua LARGE (*)    All other components within normal limits  LIPASE, BLOOD  PATHOLOGIST SMEAR REVIEW    EKG EKG Interpretation  Date/Time:  Thursday February 16 2021 21:23:55 EDT Ventricular Rate:  64 PR Interval:  193 QRS Duration: 90 QT Interval:  420 QTC Calculation: 434 R Axis:   67 Text Interpretation: Sinus rhythm Right atrial enlargement Minimal ST depression, diffuse leads No significant change since last tracing Confirmed by Dorie Rank [id number]) on 02/16/2021 9:46:57 PM  Radiology CT ABDOMEN PELVIS W CONTRAST  Result Date: 02/16/2021 CLINICAL DATA:  Abdomen pain nausea EXAM: CT ABDOMEN AND PELVIS WITH CONTRAST TECHNIQUE: Multidetector CT imaging of the abdomen and pelvis was performed using the standard protocol following bolus administration of intravenous contrast. CONTRAST:  47m OMNIPAQUE IOHEXOL 350 MG/ML SOLN COMPARISON:  MRI lumbar spine 06/28/2018 FINDINGS: Lower chest: Lung bases demonstrate no acute consolidation or effusion. Atelectasis or scarring at the right base. Hepatobiliary: Subcentimeter hypodensity in the liver too small to further characterize. No calcified stone or biliary  dilatation Pancreas: Unremarkable. No pancreatic ductal dilatation or surrounding inflammatory changes. Spleen: Normal in size without focal abnormality. Adrenals/Urinary Tract: Adrenal glands are within normal limits. No hydronephrosis. Cysts in the kidneys. Subcentimeter hypodensities too small to further characterize. The bladder is empty Stomach/Bowel: The stomach is nonenlarged. No dilated small bowel. Extensive diffuse diverticular disease of the colon. Possible mild wall thickening of the transverse colon. Negative appendix. Vascular/Lymphatic: Mild aortic atherosclerosis. No aneurysm. No suspicious nodes. Reproductive: Uterus and bilateral adnexa are unremarkable. Other: Negative for free air or free fluid. Musculoskeletal: Left hip replacement with artifact. Moderate superior endplate compression fracture at L2, possibly chronic but new since 2019 IMPRESSION: 1. Diffuse diverticular disease of the colon. Possible mild wall thickening of the transverse colon though not much inflammation, findings questionable for mild diverticulitis. No perforation or abscess. 2. Moderate superior endplate fracture at L2, probably chronic but new since 2019 Electronically Signed   By: KDonavan FoilM.D.   On: 02/16/2021 22:46    Procedures Procedures   Medications Ordered in ED Medications  cefTRIAXone (ROCEPHIN) 1 g in sodium chloride 0.9 % 100 mL IVPB (1 g Intravenous New Bag/Given 02/16/21 2334)  pantoprazole (PROTONIX) injection 40 mg (40 mg Intravenous Given 02/16/21 2150)  alum & mag hydroxide-simeth (MAALOX/MYLANTA) 200-200-20 MG/5ML suspension 30 mL (30 mLs Oral Given 02/16/21 2150)  ondansetron (ZOFRAN) injection 4 mg (4 mg Intravenous Given 02/16/21 2150)  iohexol (OMNIPAQUE) 350 MG/ML injection 80 mL (80 mLs Intravenous Contrast Given 02/16/21 2216)    ED Course  I have reviewed the triage vital signs and the nursing notes.  Pertinent labs & imaging  results that were available during my care of the  patient were reviewed by me and considered in my medical decision making (see chart for details).  Clinical Course as of 02/16/21 2358  Thu Feb 16, 2021  2057 WBC elevated but chronic.  CMEt normal [JK]  2302 Urinalysis does show large leukocyte esterase, 21-50 white blood cells and 11-20 red blood cells [JK]  2302 CT scan shows questionable findings of diverticulitis [JK]    Clinical Course User Index [JK] Dorie Rank, MD   MDM Rules/Calculators/A&P                           Patient presented to the ED for evaluation of abdominal pain.  Patient did have tenderness in the epigastric region.  Considered cardiac etiology although symptoms are not typical and EKG is reassuring.  Patient has been having a lot of burping and belching.  I do think she has a component of gastritis.  She was given GI cocktail and Protonix and noted some improvement.  With her abdominal tenderness and age CT scan was performed.  Patient's labs do not show signs of hepatitis or pancreatitis.  Urinalysis suggest the possibility of infection although she is not having typical UTI symptoms.  Patient CT scan shows possible colon thickening suggestive of mild diverticulitis.  Patient does have a penicillin allergy but she is tolerated cephalosporins.  I will try her on a combination of Ceftin and Flagyl should cover UTI mild diverticulitis.  We will also start her on antacids.  Patient also had some questions about medications for stress and anxiety.  Encouraged outpatient follow-up with her primary care doctor to discuss that further Final Clinical Impression(s) / ED Diagnoses Final diagnoses:  Abdominal pain, unspecified abdominal location  Acute gastritis without hemorrhage, unspecified gastritis type    Rx / DC Orders ED Discharge Orders          Ordered    pantoprazole (PROTONIX) 20 MG tablet  Daily        02/16/21 2357    cefUROXime (CEFTIN) 250 MG tablet  2 times daily with meals        02/16/21 2357     metroNIDAZOLE (FLAGYL) 250 MG tablet  2 times daily        02/16/21 2357             Dorie Rank, MD 02/17/21 0001

## 2021-02-16 NOTE — ED Triage Notes (Signed)
Per patient nausea, anxiety(stress from son having pancreatic cancer and husband having dementia), upper abd pain x 3 days. denies diarrhea/vomiting.

## 2021-02-16 NOTE — ED Provider Notes (Signed)
Emergency Medicine Provider Triage Evaluation Note  Kelli Lucas , a 87 y.o. female  was evaluated in triage.  Pt complains of nausea, dry heaving.  Review of Systems  Positive: Nausea, dry heaving Negative: Vomiting, abd pain, diarrhea, constipation, chest pain  Physical Exam  BP 140/68   Pulse 69   Temp 98.5 F (36.9 C)   Resp 20   Ht 5' (1.524 m)   Wt 49 kg   SpO2 98%   BMI 21.10 kg/m  Gen:   Awake, no distress   Resp:  Normal effort  MSK:   Moves extremities without difficulty  Other:  Abd soft and nontender  Medical Decision Making  Medically screening exam initiated at 3:45 PM.  Appropriate orders placed.  Arria Polsky was informed that the remainder of the evaluation will be completed by another provider, this initial triage assessment does not replace that evaluation, and the importance of remaining in the ED until their evaluation is complete.     Bishop Dublin 02/16/21 1546    Teressa Lower, MD 02/16/21 1806

## 2021-02-17 LAB — PATHOLOGIST SMEAR REVIEW

## 2021-04-29 IMAGING — DX DG PORTABLE PELVIS
1 series · 1 of 1 positions shown · non-contrast
Comparison: None.

CLINICAL DATA: Status post LEFT hip replacement

EXAM:
PORTABLE PELVIS 1-2 VIEWS

[pelvis ap]
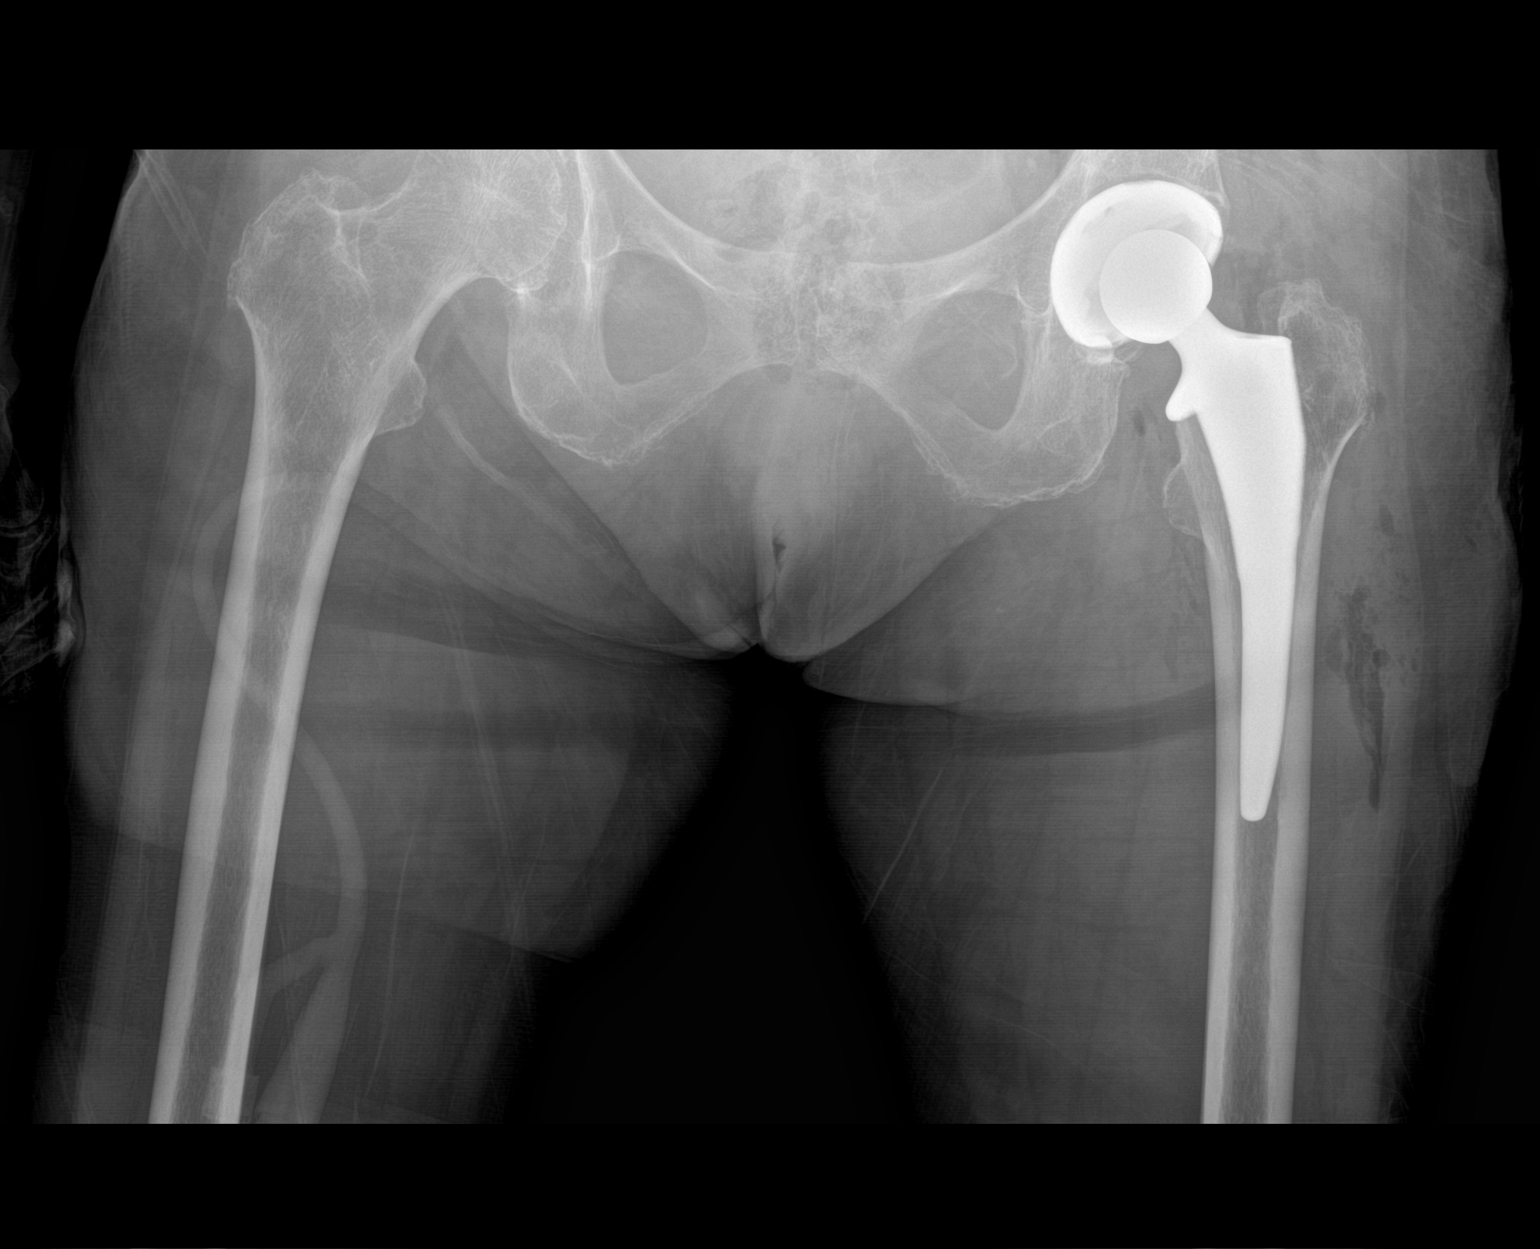

[1 of 1 positions shown; findings below may reference images not displayed]

FINDINGS: LEFT hip total arthroplasty. No fracture or dislocation. Suspected
soft tissue changes.
IMPRESSION: Total hip arthroplasty without complication.

## 2021-05-18 DIAGNOSIS — R002 Palpitations: Secondary | ICD-10-CM | POA: Insufficient documentation

## 2021-05-18 NOTE — Progress Notes (Signed)
Cardiology Office Note:    Date:  05/19/2021   ID:  Kelli Lucas, DOB 06-Jun-1930, MRN [id number]  PCP:  Kelli Sacramento, MD   [facility] Providers Cardiologist:  Kelli Mocha, MD    Referring MD: Kelli Sacramento, MD   Chief Complaint:  Follow-up for palpitations    Patient Profile:   Kelli Lucas is a 87 y.o. female with:  Heart palpitations Hypertension Hyperlipidemia S/p CVA in 2013 S/p L THR CLL GERD    History of Present Illness: [name] was last seen by Dr. Burt Lucas in 11/21.  She returns for follow-up.  She is here with her husband, who is also seen today.  Unfortunately, her son passed away from cancer in 2023/02/24.  She had a bout of diverticulitis around that time.  She has not had any palpitations, chest pain, shortness of breath, syncope.      ASSESSMENT & PLAN:   Essential hypertension Blood pressure is well controlled.  Continue diltiazem 180 mg daily, HCTZ 12.5 mg daily, metoprolol succinate 50 mg daily.  Palpitations Overall quiescent on a combination of diltiazem and metoprolol succinate  Mixed hyperlipidemia Managed by primary care.  She remains on atorvastatin.            Dispo:  Return in about 1 year (around 05/19/2022) for Routine follow up in 1 year with Dr. Burt Lucas. .    Prior CV studies: Event monitor 10/19/11 PVCs, no A. Fib  TEE 02/11/276 Normal LV systolic function, trace MR, atrial septal aneurysm  Echocardiogram 10/14/2011 Mild LVH, EF 55-60, no RWMA, G1 DD, trivial MR, mild LAE, normal RVSF    Past Medical History:  Diagnosis Date   Anginal pain (HCC)    Arthritis    knees, hip   Chronic lymphoid leukemia (HCC)    Complication of anesthesia    Dysrhythmia    from teenager on   GERD (gastroesophageal reflux disease)    Headache    Hx   Heart palpitations     lifelong symptoms   History of hiatal hernia    HLD (hyperlipidemia)    takes lipitor-last lipid panel about 3 mo ago, and was good   Hypertension    Leukemia,  chronic lymphoid ([place])    chronic lymphocytis Leukemia-MD Veronia Beets had this for about about 1 yr.   Is no t on any current therapy-is being watcvhed   Panic attacks    Hx   PONV (postoperative nausea and vomiting)    Stroke ([place]) 2013   Current Medications: Current Meds  Medication Sig   aspirin 325 MG tablet Take 325 mg by mouth daily.   atorvastatin (LIPITOR) 40 MG tablet Take 40 mg by mouth daily at 6 PM.    Cyanocobalamin (VITAMIN B-12) 5000 MCG TBDP Take 1,000 Units by mouth daily.   diltiazem (CARDIZEM CD) 180 MG 24 hr capsule Take 180 mg by mouth daily.   FLUoxetine (PROZAC) 40 MG capsule Take 1 capsule by mouth daily.   hydrochlorothiazide (HYDRODIURIL) 12.5 MG tablet Take 12.5 mg by mouth daily.   metoprolol succinate (TOPROL-XL) 50 MG 24 hr tablet Take 50 mg by mouth every evening. Take with or immediately following a meal.     Allergies:   Phenobarbital, Penicillins, and Sulfa antibiotics   Social History   Tobacco Use   Smoking status: Never   Smokeless tobacco: Never  Vaping Use   Vaping Use: Never used  Substance Use Topics   Alcohol use: No   Drug use:  No    Family Hx: The patient's family history includes Coronary artery disease in her father; Heart failure in her mother; Stroke in her father.  Review of Systems  Gastrointestinal:  Negative for hematochezia.  Genitourinary:  Negative for hematuria.    EKGs/Labs/Other Test Reviewed:    EKG:  EKG is  ordered today.  The ekg ordered today demonstrates NSR, HR 67, normal axis, no ST-T wave changes, QTC 414  Recent Labs: 02/16/2021: ALT 19; BUN 14; Creatinine, Ser 0.73; Hemoglobin 14.2; Platelets 205; Potassium 4.0; Sodium 132   Recent Lipid Panel Lab Results  Component Value Date/Time   CHOL 154 10/13/2011 05:00 AM   TRIG 105 10/13/2011 05:00 AM   HDL 47 10/13/2011 05:00 AM   LDLCALC 86 10/13/2011 05:00 AM     Risk Assessment/Calculations:          Physical Exam:    VS:  BP 130/60    Pulse 67   Ht 4\' 11"  (1.499 m)   Wt 105 lb (47.6 kg)   SpO2 97%   BMI 21.21 kg/m     Wt Readings from Last 3 Encounters:  05/19/21 105 lb (47.6 kg)  02/16/21 108 lb 0.4 oz (49 kg)  05/16/20 109 lb 6.4 oz (49.6 kg)    Constitutional:      Appearance: Healthy appearance. Not in distress.  Neck:     Vascular: JVD normal.  Pulmonary:     Effort: Pulmonary effort is normal.     Breath sounds: No wheezing. No rales.  Cardiovascular:     Normal rate. Regular rhythm. Normal S1. Normal S2.      Murmurs: There is no murmur.  Edema:    Peripheral edema absent.  Abdominal:     Palpations: Abdomen is soft.  Skin:    General: Skin is warm and dry.  Neurological:     Mental Status: Alert and oriented to person, place and time.     Cranial Nerves: Cranial nerves are intact.        Medication Adjustments/Labs and Tests Ordered: Current medicines are reviewed at length with the patient today.  Concerns regarding medicines are outlined above.  Tests Ordered: Orders Placed This Encounter  Procedures   EKG 12-Lead    Medication Changes: No orders of the defined types were placed in this encounter.  Signed, Richardson Dopp, PA-C  05/19/2021 2:19 PM    [facility] [address], [place], [place]  [address] Phone: [phone]; Fax: [phone]

## 2021-05-19 ENCOUNTER — Other Ambulatory Visit: Payer: Self-pay

## 2021-05-19 ENCOUNTER — Encounter: Payer: Self-pay | Admitting: Physician Assistant

## 2021-05-19 ENCOUNTER — Ambulatory Visit (INDEPENDENT_AMBULATORY_CARE_PROVIDER_SITE_OTHER): Payer: Medicare Other | Admitting: Physician Assistant

## 2021-05-19 VITALS — BP 130/60 | HR 67 | Ht 59.0 in | Wt 105.0 lb

## 2021-05-19 DIAGNOSIS — R002 Palpitations: Secondary | ICD-10-CM | POA: Diagnosis not present

## 2021-05-19 DIAGNOSIS — I10 Essential (primary) hypertension: Secondary | ICD-10-CM | POA: Diagnosis not present

## 2021-05-19 DIAGNOSIS — E782 Mixed hyperlipidemia: Secondary | ICD-10-CM

## 2021-05-19 NOTE — Patient Instructions (Signed)
Medication Instructions:   Your physician recommends that you continue on your current medications as directed. Please refer to the Current Medication list given to you today.  *If you need a refill on your cardiac medications before your next appointment, please call your pharmacy*   Lab Work:  -NONE  If you have labs (blood work) drawn today and your tests are completely normal, you will receive your results only by: [place] (if you have MyChart) OR A paper copy in the mail If you have any lab test that is abnormal or we need to change your treatment, we will call you to review the results.   Testing/Procedures:  -NONE   Follow-Up: At [facility], you and your health needs are our priority.  As part of our continuing mission to provide you with exceptional heart care, we have created designated Provider Care Teams.  These Care Teams include your primary Cardiologist (physician) and Advanced Practice Providers (APPs -  Physician Assistants and Nurse Practitioners) who all work together to provide you with the care you need, when you need it.  We recommend signing up for the patient portal called "MyChart".  Sign up information is provided on this After Visit Summary.  MyChart is used to connect with patients for Virtual Visits (Telemedicine).  Patients are able to view lab/test results, encounter notes, upcoming appointments, etc.  Non-urgent messages can be sent to your provider as well.   To learn more about what you can do with MyChart, go to [email or web address].    Your next appointment:   1 year(s)  The format for your next appointment:   In Person  Provider:   Sherren Mocha, MD    Other Instructions  Your physician wants you to follow-up in: 1 year with Dr. Burt Knack.  You will receive a reminder letter in the mail two months in advance. If you don't receive a letter, please call our office to schedule the follow-up appointment.

## 2021-05-19 NOTE — Assessment & Plan Note (Signed)
Blood pressure is well controlled.  Continue diltiazem 180 mg daily, HCTZ 12.5 mg daily, metoprolol succinate 50 mg daily.

## 2021-05-19 NOTE — Assessment & Plan Note (Signed)
Overall quiescent on a combination of diltiazem and metoprolol succinate

## 2021-05-19 NOTE — Assessment & Plan Note (Signed)
Managed by primary care.  She remains on atorvastatin.

## 2021-12-31 ENCOUNTER — Other Ambulatory Visit: Payer: Self-pay

## 2021-12-31 ENCOUNTER — Emergency Department (HOSPITAL_COMMUNITY): Payer: Medicare Other

## 2021-12-31 ENCOUNTER — Encounter (HOSPITAL_COMMUNITY): Payer: Self-pay | Admitting: Emergency Medicine

## 2021-12-31 ENCOUNTER — Emergency Department (HOSPITAL_COMMUNITY)
Admission: EM | Admit: 2021-12-31 | Discharge: 2021-12-31 | Disposition: A | Discharge status: Home / Self Care | Payer: Medicare Other | Attending: Emergency Medicine | Admitting: Emergency Medicine

## 2021-12-31 DIAGNOSIS — W1839XA Other fall on same level, initial encounter: Secondary | ICD-10-CM | POA: Diagnosis not present

## 2021-12-31 DIAGNOSIS — S8992XA Unspecified injury of left lower leg, initial encounter: Secondary | ICD-10-CM | POA: Diagnosis present

## 2021-12-31 DIAGNOSIS — S82142A Displaced bicondylar fracture of left tibia, initial encounter for closed fracture: Secondary | ICD-10-CM | POA: Diagnosis not present

## 2021-12-31 DIAGNOSIS — M79605 Pain in left leg: Secondary | ICD-10-CM | POA: Diagnosis present

## 2021-12-31 MED ORDER — OXYCODONE HCL 5 MG PO TABS: 5.0000 mg | ORAL_TABLET | Freq: Four times a day (QID) | ORAL | 0 refills | Status: AC | PRN

## 2021-12-31 MED ORDER — ONDANSETRON 4 MG PO TBDP
4.0000 mg | ORAL_TABLET | Freq: Once | ORAL | Status: AC
  Administered 2021-12-31: 4 mg via ORAL
  Filled 2021-12-31: qty 1

## 2021-12-31 MED ORDER — ACETAMINOPHEN 325 MG PO TABS: 650.0000 mg | ORAL_TABLET | Freq: Four times a day (QID) | ORAL | 0 refills | Status: DC | PRN

## 2021-12-31 MED ORDER — OXYCODONE HCL 5 MG PO TABS
5.0000 mg | ORAL_TABLET | Freq: Once | ORAL | Status: AC
  Administered 2021-12-31: 5 mg via ORAL
  Filled 2021-12-31: qty 1

## 2021-12-31 NOTE — ED Triage Notes (Signed)
Patient brought in by wheelchair by daughter. Patients daughter states patient was trying to help catch her husband falling and caused her to fall onto her left leg. Patient states she heard a loud crack in her knee and cannot bear any weight now.

## 2021-12-31 NOTE — Progress Notes (Signed)
Orthopedic Tech Progress Note Patient Details:  Kelli Lucas [birth year] [id number]  Ortho Devices Type of Ortho Device: Ace wrap, Knee Immobilizer Ortho Device/Splint Location: left Ortho Device/Splint Interventions: Application   Post Interventions Patient Tolerated: Well Instructions Provided: Care of device, Adjustment of device  Saul Fordyce 12/31/2021, 3:12 PM

## 2021-12-31 NOTE — ED Provider Notes (Signed)
[facility]-EMERGENCY DEPT Provider Note   CSN: [id number] Arrival date & time: 12/31/21  1156     History  Chief Complaint  Patient presents with   Fall    Kelli Lucas is a 88 y.o. female.   Fall Pertinent negatives include no chest pain, no abdominal pain and no shortness of breath.   Patient is a 88 year old female presenting to pain in the left leg just below the knee after her husband fell on her he heard a loud cracking noise.  Patient denies any sensation or motor deficits.  Denies any open wounds or rashes.    Home Medications Prior to Admission medications   Medication Sig Start Date End Date Taking? Authorizing Provider  acetaminophen (TYLENOL) 325 MG tablet Take 2 tablets (650 mg total) by mouth every 6 (six) hours as needed. 12/31/21  Yes Edwin Dada P, DO  oxyCODONE (ROXICODONE) 5 MG immediate release tablet Take 1 tablet (5 mg total) by mouth every 6 (six) hours as needed for up to 3 days for severe pain. 12/31/21 01/03/22 Yes Edwin Dada P, DO  aspirin 325 MG tablet Take 325 mg by mouth daily.    [provider]  atorvastatin (LIPITOR) 40 MG tablet Take 40 mg by mouth daily at 6 PM.     [provider]  Cyanocobalamin (VITAMIN B-12) 5000 MCG TBDP Take 1,000 Units by mouth daily.    [provider]  diltiazem (CARDIZEM CD) 180 MG 24 hr capsule Take 180 mg by mouth daily.    [provider]  FLUoxetine (PROZAC) 40 MG capsule Take 1 capsule by mouth daily. 04/27/21   [provider]  hydrochlorothiazide (HYDRODIURIL) 12.5 MG tablet Take 12.5 mg by mouth daily.    [provider]  metoprolol succinate (TOPROL-XL) 50 MG 24 hr tablet Take 50 mg by mouth every evening. Take with or immediately following a meal.     [provider]      Allergies    Phenobarbital, Penicillins, and Sulfa antibiotics    Review of Systems   Review of Systems  Constitutional:  Negative for chills and  fever.  HENT:  Negative for ear pain and sore throat.   Eyes:  Negative for pain and visual disturbance.  Respiratory:  Negative for cough and shortness of breath.   Cardiovascular:  Negative for chest pain and palpitations.  Gastrointestinal:  Negative for abdominal pain and vomiting.  Genitourinary:  Negative for dysuria and hematuria.  Musculoskeletal:  Negative for arthralgias and back pain.  Skin:  Negative for color change and rash.  Neurological:  Negative for seizures and syncope.  All other systems reviewed and are negative.   Physical Exam Updated Vital Signs BP 131/66   Pulse 77   Temp 98.8 F (37.1 C) (Oral)   Resp 17   Ht 4\' 11"  (1.499 m)   Wt 47.6 kg   SpO2 100%   BMI 21.21 kg/m  Physical Exam Vitals and nursing note reviewed.  Constitutional:      General: She is not in acute distress.    Appearance: She is well-developed.  HENT:     Head: Normocephalic and atraumatic.  Eyes:     Conjunctiva/sclera: Conjunctivae normal.  Cardiovascular:     Rate and Rhythm: Normal rate and regular rhythm.     Pulses:          Dorsalis pedis pulses are 2+ on the right side and 2+ on the left side.  Heart sounds: No murmur heard. Pulmonary:     Effort: Pulmonary effort is normal. No respiratory distress.     Breath sounds: Normal breath sounds.  Abdominal:     Palpations: Abdomen is soft.     Tenderness: There is no abdominal tenderness.  Musculoskeletal:        General: No swelling.     Cervical back: Neck supple.     Right hip: Normal.     Left hip: Normal.     Right upper leg: Normal.     Left upper leg: Normal.     Right knee: Normal.     Left knee: Swelling, deformity and bony tenderness present.     Right lower leg: Normal.     Left lower leg: Bony tenderness present.     Right ankle: Normal.     Left ankle: Normal.  Skin:    General: Skin is warm and dry.     Capillary Refill: Capillary refill takes less than 2 seconds.  Neurological:     Mental  Status: She is alert.     GCS: GCS eye subscore is 4. GCS verbal subscore is 5. GCS motor subscore is 6.     Sensory: Sensation is intact.     Motor: Motor function is intact.  Psychiatric:        Mood and Affect: Mood normal.     ED Results / Procedures / Treatments   Labs (all labs ordered are listed, but only abnormal results are displayed) Labs Reviewed - No data to display  EKG None  Radiology CT Tibia Fibula Left Wo Contrast  Result Date: 12/31/2021 CLINICAL DATA:  Tibial plateau fracture. EXAM: CT OF THE LOWER LEFT EXTREMITY WITHOUT CONTRAST TECHNIQUE: Multidetector CT imaging of the lower left extremity was performed according to the standard protocol. RADIATION DOSE REDUCTION: This exam was performed according to the departmental dose-optimization program which includes automated exposure control, adjustment of the mA and/or kV according to patient size and/or use of iterative reconstruction technique. COMPARISON:  Radiographs, same date. FINDINGS: Complex comminuted depressed lateral tibial plateau fracture as demonstrated on the radiographs. Maximum depression is estimated at 10 mm. The fracture extends out through the lateral cortex of the metaphysis. Fracture also involves the lateral tibial spine. The medial tibial spine and medial tibial plateau are intact. There is an avulsion type fracture involving the medial femoral condyle near the MCL attachment. No patella or fibular fracture. Associated hem status arthrosis. The surrounding knee and upper calf musculature are unremarkable. Grossly by CT the cruciate ligaments are intact. The quadriceps and patellar tendons are intact. Difficult to assess the collateral ligaments. IMPRESSION: 1. Complex comminuted depressed lateral tibial plateau fracture as described above. 2. Small avulsion type fracture involving the medial femoral condyle near the MCL attachment. 3. Associated hemarthrosis. Electronically Signed   By: Rudie Meyer M.D.    On: 12/31/2021 14:33   DG Tibia/Fibula Left Port  Result Date: 12/31/2021 CLINICAL DATA:  Fall. EXAM: PORTABLE LEFT TIBIA AND FIBULA - 2 VIEW COMPARISON:  None Available. FINDINGS: Depressed fracture lateral tibial plateau. Distal tibia intact. No fibular fracture. Calcification adjacent to the medial femoral condyle likely due to chronic ligament injury. IMPRESSION: Depressed fracture lateral tibial plateau. Electronically Signed   By: Marlan Palau M.D.   On: 12/31/2021 12:33   DG Knee Left Port  Result Date: 12/31/2021 CLINICAL DATA:  Fall EXAM: PORTABLE LEFT KNEE - 1-2 VIEW COMPARISON:  Left hip fib 12/31/2021 FINDINGS: Depressed fracture of  the lateral tibial plateau.  Joint effusion No fracture of the femur or fibula. IMPRESSION: Depressed fracture lateral tibial plateau. Electronically Signed   By: Marlan Palau M.D.   On: 12/31/2021 12:31    Procedures Procedures    Medications Ordered in ED Medications  oxyCODONE (Oxy IR/ROXICODONE) immediate release tablet 5 mg (5 mg Oral Given 12/31/21 1252)  ondansetron (ZOFRAN-ODT) disintegrating tablet 4 mg (4 mg Oral Given 12/31/21 1252)    ED Course/ Medical Decision Making/ A&P                           Medical Decision Making Amount and/or Complexity of Data Reviewed Radiology: ordered.  Risk OTC drugs. Prescription drug management.   7:50 PM 88 year old female presenting to pain in the left leg just below the knee after her husband fell on her he heard a loud cracking noise.  Is alert and oriented x3, no acute distress, afebrile, stable vital signs.  Physical exam demonstrates gross deficits of the left lower extremity.  X-ray demonstrates tibial plateau fracture.  CT scan demonstrates comminuted we will plateau fracture with small avulsion type fracture involving the medial femoral condyle near the MCL attachment.  Associated hemarthrosis.  Patient and patient's family is very adamant that patient must return home today due to  family dying husband.  I spoke with orthopedic surgeon on-call Dr. Shon Baton recommends well-padded knee immobilizer and no weightbearing activities.  Recommends calling for thing tomorrow morning for scheduling appointment with Dr. Darrelyn Hillock.  Medication for pain control sent to pharmacy.  Patient's compartments remain soft.  Lower extremity is neurovascular intact.  +2 dorsalis pedis.  No sensation or motor deficits.  Signs and symptoms of compartment syndrome described in detail with recommendations for prompt return to emergency department if any develop.  Patient in no distress and overall condition improved here in the ED. Detailed discussions were had with the patient regarding current findings, and need for close f/u with PCP or on call doctor. The patient has been instructed to return immediately if the symptoms worsen in any way for re-evaluation. Patient verbalized understanding and is in agreement with current care plan. All questions answered prior to discharge.           Final Clinical Impression(s) / ED Diagnoses Final diagnoses:  Closed fracture of left tibial plateau, initial encounter    Rx / DC Orders ED Discharge Orders          Ordered    oxyCODONE (ROXICODONE) 5 MG immediate release tablet  Every 6 hours PRN        12/31/21 1512    acetaminophen (TYLENOL) 325 MG tablet  Every 6 hours PRN        12/31/21 1512              Franne Forts, DO 12/31/21 1515

## 2022-02-21 ENCOUNTER — Other Ambulatory Visit (HOSPITAL_COMMUNITY): Payer: Self-pay | Admitting: Physician Assistant

## 2022-02-21 ENCOUNTER — Ambulatory Visit (HOSPITAL_COMMUNITY)
Admission: RE | Admit: 2022-02-21 | Discharge: 2022-02-21 | Disposition: A | Discharge status: Home / Self Care | Payer: Medicare Other | Source: Ambulatory Visit | Attending: Internal Medicine | Admitting: Internal Medicine

## 2022-02-21 DIAGNOSIS — M79605 Pain in left leg: Secondary | ICD-10-CM

## 2022-03-04 IMAGING — CT CT ABD-PELV W/ CM
3 of 5 series · 16 of 46 positions shown, 18 images · IV contrast (omnipaque)
Comparison: MRI lumbar spine 06/28/2018

CLINICAL DATA: Abdomen pain nausea

EXAM:
CT ABDOMEN AND PELVIS WITH CONTRAST
TECHNIQUE: Multidetector CT imaging of the abdomen and pelvis was performed
using the standard protocol following bolus administration of
intravenous contrast.
CONTRAST:  80mL OMNIPAQUE IOHEXOL 350 MG/ML SOLN

[Series 2: axial st · axial · 0.68mm/px · z∈[-451,-121]mm · 11 of 80 slices shown, 13 images]
[im 7/80  soft-tissue]
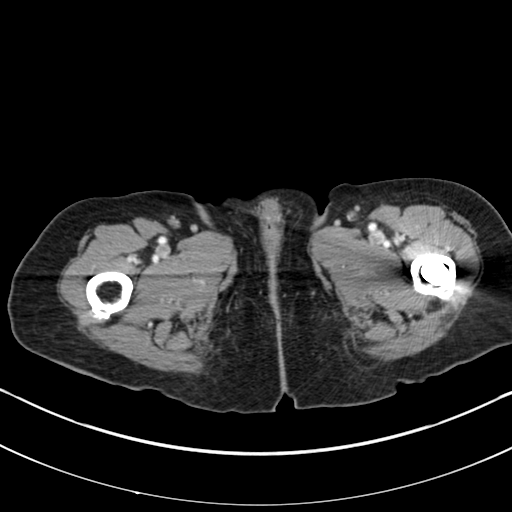
[im 7/80  bone]
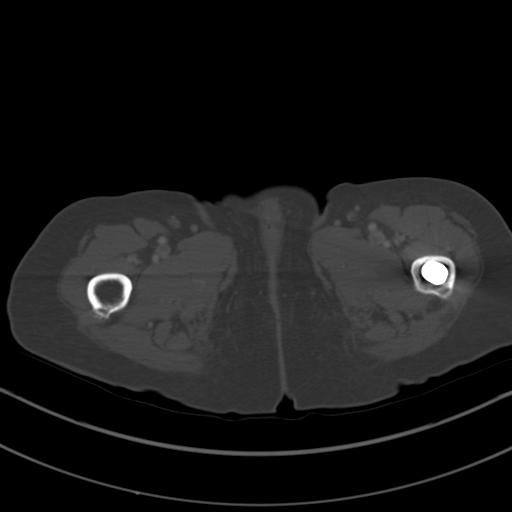
[im 14/80  soft-tissue]
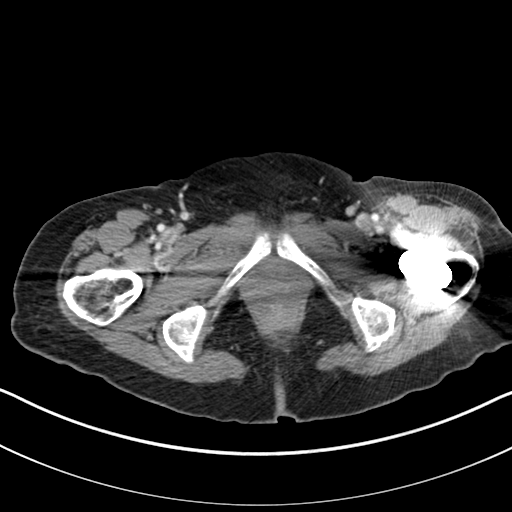
[im 20/80  soft-tissue]
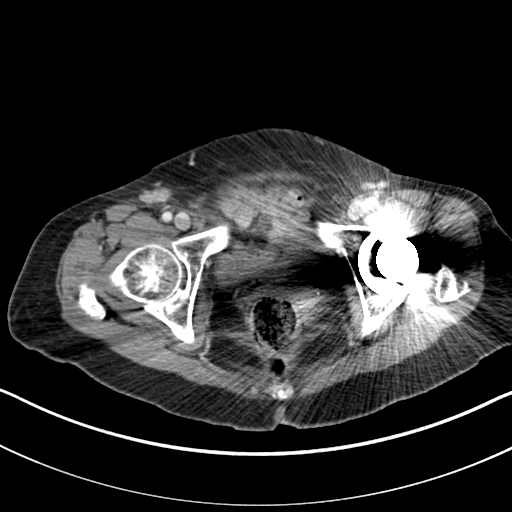
[im 27/80  soft-tissue]
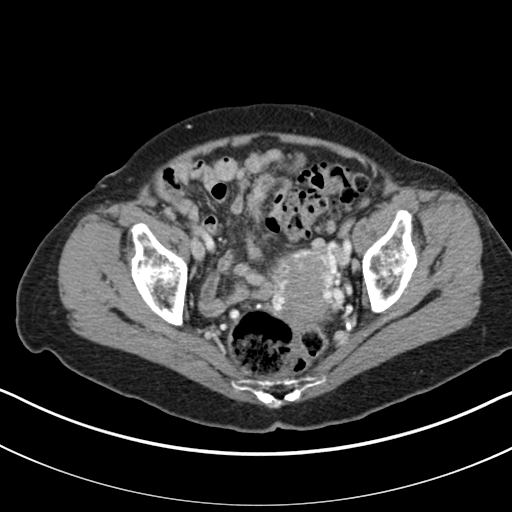
[im 33/80  soft-tissue]
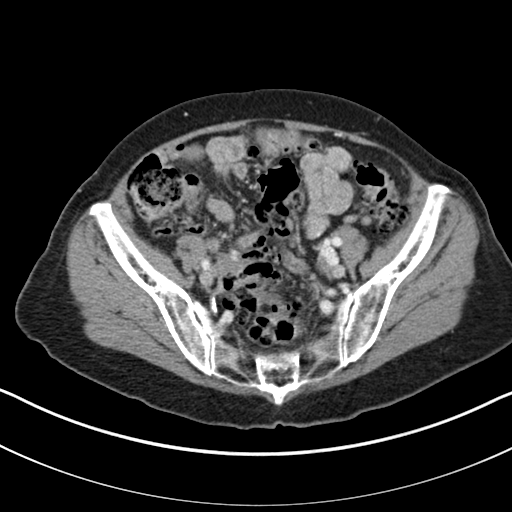
[im 40/80  soft-tissue]
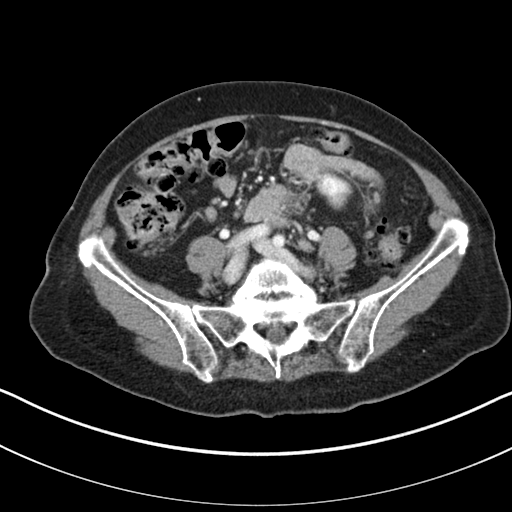
[im 47/80  soft-tissue]
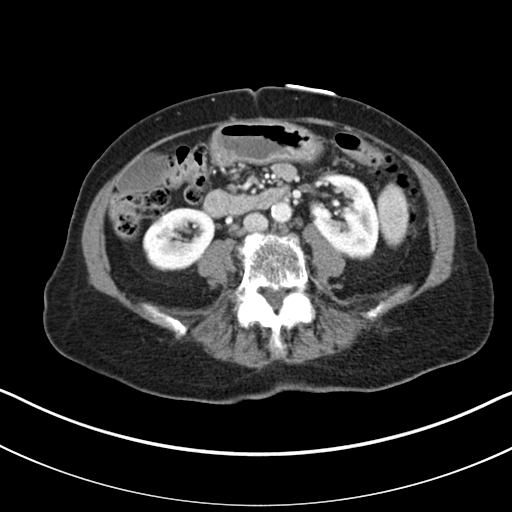
[im 53/80  soft-tissue]
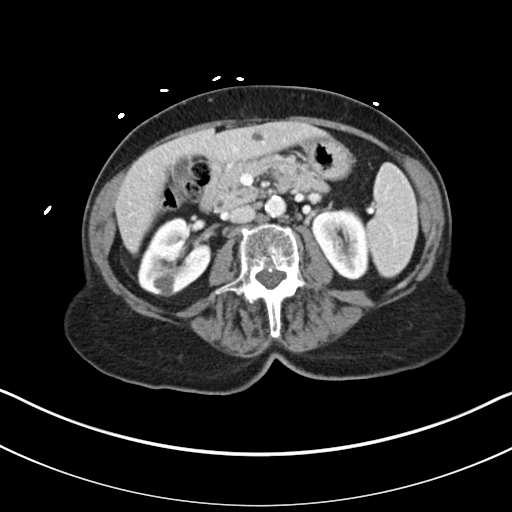
[im 60/80  soft-tissue]
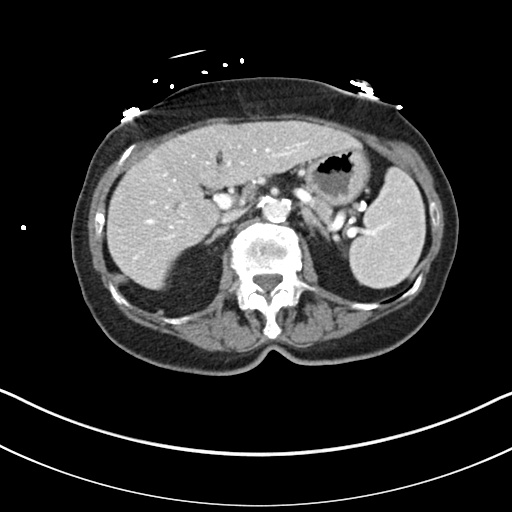
[im 60/80  bone]
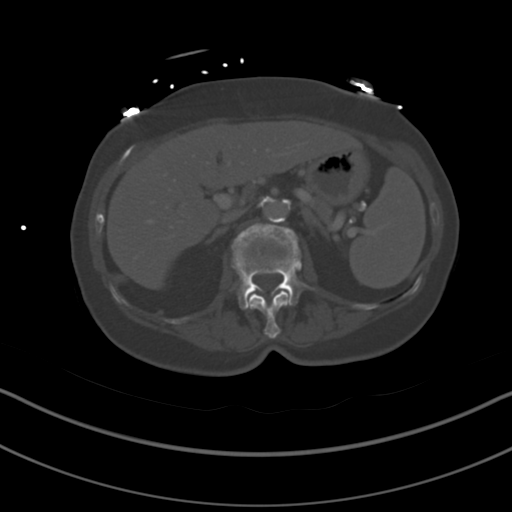
[im 66/80  soft-tissue]
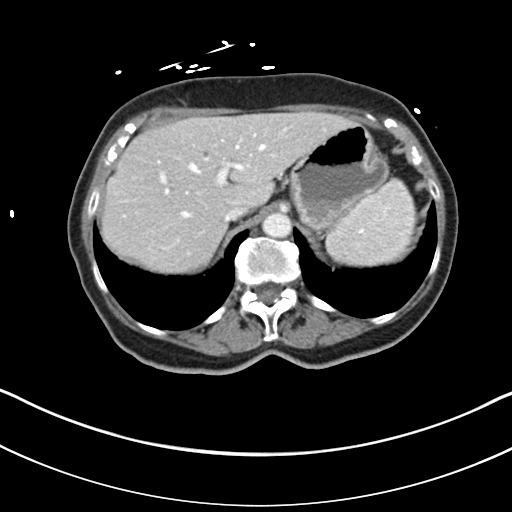
[im 73/80  soft-tissue]
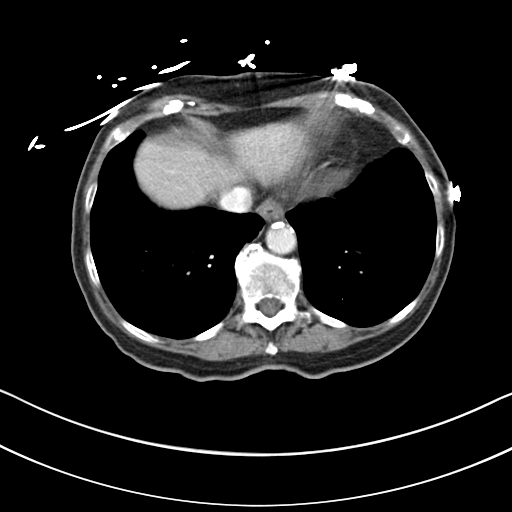

[Series 4: coronal st · coronal · 0.76mm/px · 3 of 120 slices shown]
[im 40/120  soft-tissue]
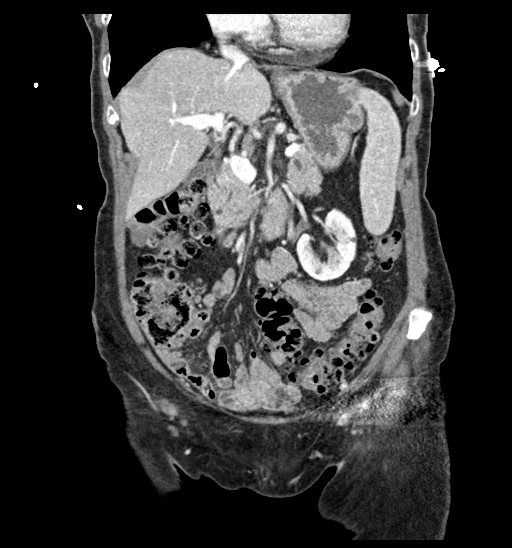
[im 53/120  soft-tissue]
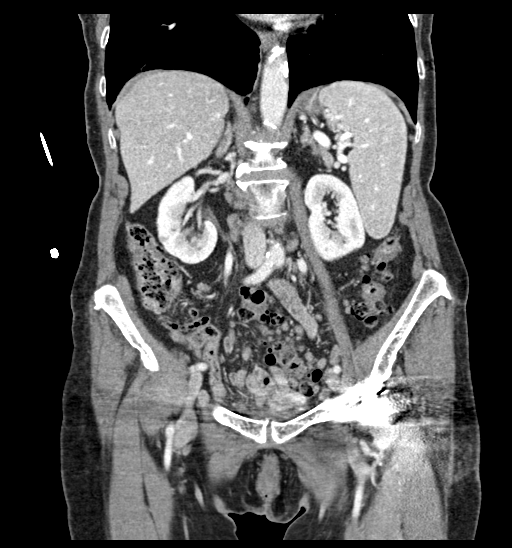
[im 67/120  soft-tissue]
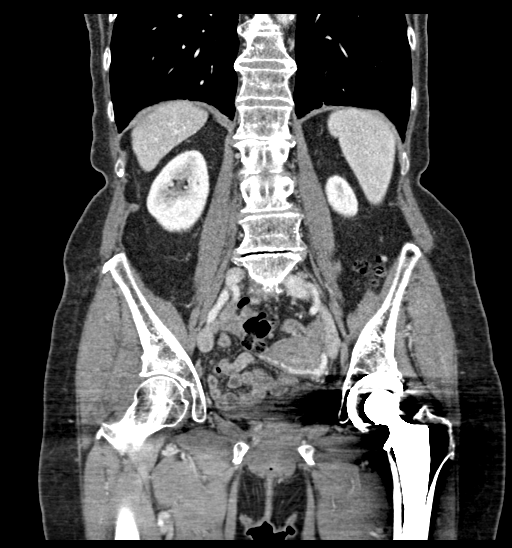

[Series 6: lung bases · axial · 0.68mm/px · z∈[-238,-214]mm · 2 of 83 slices shown]
[im 7/83  bone]
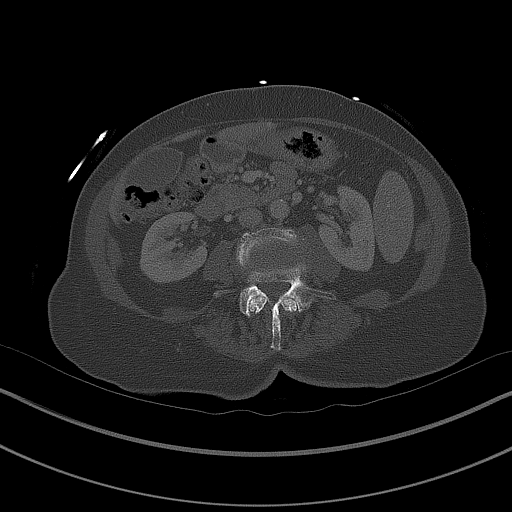
[im 19/83  bone]
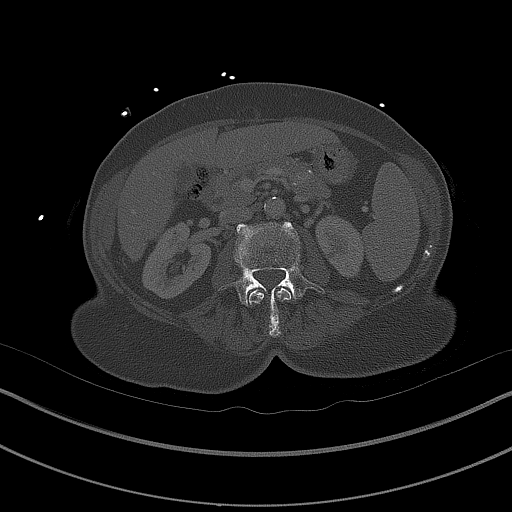

[16 of 46 positions shown; findings below may reference images not displayed]

FINDINGS: Lower chest: Lung bases demonstrate no acute consolidation or
effusion. Atelectasis or scarring at the right base.

Hepatobiliary: Subcentimeter hypodensity in the liver too small to
further characterize. No calcified stone or biliary dilatation

Pancreas: Unremarkable. No pancreatic ductal dilatation or
surrounding inflammatory changes.

Spleen: Normal in size without focal abnormality.

Adrenals/Urinary Tract: Adrenal glands are within normal limits. No
hydronephrosis. Cysts in the kidneys. Subcentimeter hypodensities
too small to further characterize. The bladder is empty

Stomach/Bowel: The stomach is nonenlarged. No dilated small bowel.
Extensive diffuse diverticular disease of the colon. Possible mild
wall thickening of the transverse colon. Negative appendix.

Vascular/Lymphatic: Mild aortic atherosclerosis. No aneurysm. No
suspicious nodes.

Reproductive: Uterus and bilateral adnexa are unremarkable.

Other: Negative for free air or free fluid.

Musculoskeletal: Left hip replacement with artifact. Moderate
superior endplate compression fracture at L2, possibly chronic but
new since 0368
IMPRESSION: 1. Diffuse diverticular disease of the colon. Possible mild wall
thickening of the transverse colon though not much inflammation,
findings questionable for mild diverticulitis. No perforation or
abscess.
2. Moderate superior endplate fracture at L2, probably chronic but
new since [DATE]

## 2022-08-28 ENCOUNTER — Ambulatory Visit: Payer: Medicare Other | Attending: Cardiovascular Disease | Admitting: Cardiovascular Disease

## 2022-08-28 ENCOUNTER — Encounter: Payer: Self-pay | Admitting: Cardiovascular Disease

## 2022-08-28 VITALS — BP 130/80 | HR 65 | Ht 60.0 in | Wt 96.0 lb

## 2022-08-28 DIAGNOSIS — E782 Mixed hyperlipidemia: Secondary | ICD-10-CM | POA: Diagnosis not present

## 2022-08-28 DIAGNOSIS — I10 Essential (primary) hypertension: Secondary | ICD-10-CM | POA: Diagnosis not present

## 2022-08-28 DIAGNOSIS — R002 Palpitations: Secondary | ICD-10-CM | POA: Insufficient documentation

## 2022-08-28 NOTE — Progress Notes (Signed)
Cardiology Office Note:    Date:  08/28/2022   ID:  Kelli Lucas, DOB February 06, 1930, MRN [id number]  PCP:  Christain Sacramento, MD   [facility] Cardiologist:  Sherren Mocha, MD     Referring MD: Christain Sacramento, MD   No chief complaint on file.   History of Present Illness:    Kelli Lucas is a 89 y.o. female with a hx of: Heart palpitations Hypertension Hyperlipidemia S/p CVA in 2013 S/p L THR CLL GERD   The patient is here alone today.  She has had a really tough time lately with the deaths of some of her family members.  Her husband has developed progressive dementia and she has had to move him into a memory care unit.  She has had some increased heart palpitations but no major change.  Overall she thinks she is doing fine from a health standpoint.  She denies chest pain, chest pressure, or shortness of breath.  No lightheadedness or syncope.  She continues on metoprolol succinate and diltiazem which controlled her heart palpitations.  Past Medical History:  Diagnosis Date   Anginal pain ([place])    Arthritis    knees, hip   Chronic lymphoid leukemia (HCC)    Complication of anesthesia    Dysrhythmia    from teenager on   GERD (gastroesophageal reflux disease)    Headache    Hx   Heart palpitations     lifelong symptoms   History of hiatal hernia    HLD (hyperlipidemia)    takes lipitor-last lipid panel about 3 mo ago, and was good   Hypertension    Leukemia, chronic lymphoid ([place])    chronic lymphocytis Leukemia-MD Harlow Asa, Jason-has had this for about about 1 yr.   Is no t on any current therapy-is being watcvhed   Panic attacks    Hx   PONV (postoperative nausea and vomiting)    Stroke [facility]) 2013    Past Surgical History:  Procedure Laterality Date   BREAST LUMPECTOMY Left 1980s   lumpectomy   EYE SURGERY Bilateral 2019, [place]   TEE WITHOUT CARDIOVERSION  10/15/2011   Procedure: TRANSESOPHAGEAL ECHOCARDIOGRAM (TEE);   Surgeon: Fay Records, MD;  Location: [facility];  Service: Cardiovascular;  Laterality: N/A;   TONSILLECTOMY     TOTAL HIP ARTHROPLASTY Left 04/13/2020   Procedure: TOTAL HIP ARTHROPLASTY ANTERIOR APPROACH;  Surgeon: Gaynelle Arabian, MD;  Location: [facility];  Service: Orthopedics;  Laterality: Left;  1106mn    Current Medications: Current Meds  Medication Sig   acetaminophen (TYLENOL) 325 MG tablet Take 2 tablets (650 mg total) by mouth every 6 (six) hours as needed.   aspirin 325 MG tablet Take 325 mg by mouth daily.   atorvastatin (LIPITOR) 40 MG tablet Take 40 mg by mouth daily at 6 PM.    Cyanocobalamin (VITAMIN B-12) 5000 MCG TBDP Take 1,000 Units by mouth daily.   diltiazem (CARDIZEM CD) 180 MG 24 hr capsule Take 180 mg by mouth daily.   FLUoxetine (PROZAC) 40 MG capsule Take 1 capsule by mouth daily.   hydrochlorothiazide (HYDRODIURIL) 12.5 MG tablet Take 12.5 mg by mouth daily.   metoprolol succinate (TOPROL-XL) 50 MG 24 hr tablet Take 50 mg by mouth every evening. Take with or immediately following a meal.      Allergies:   Phenobarbital, Penicillins, and Sulfa antibiotics   Social History   Socioeconomic History   Marital status: Married  Spouse name: Not on file   Number of children: Not on file   Years of education: Not on file   Highest education level: Not on file  Occupational History   Not on file  Tobacco Use   Smoking status: Never   Smokeless tobacco: Never  Vaping Use   Vaping Use: Never used  Substance and Sexual Activity   Alcohol use: No   Drug use: No   Sexual activity: Not on file  Other Topics Concern   Not on file  Social History Narrative   Worked with [employer]-workwed there and then married   Network engineer, office work-   Social Determinants of Radio broadcast assistant Strain: Not on Art therapist Insecurity: Not on file  Transportation Needs: Not on file  Physical Activity: Not on file  Stress: Not on file  Social Connections: Not on file      Family History: The patient's family history includes Coronary artery disease in her father; Heart failure in her mother; Stroke in her father.  ROS:   Please see the history of present illness.    All other systems reviewed and are negative.  EKGs/Labs/Other Studies Reviewed:    EKG:  EKG is ordered today.  The ekg ordered today demonstrates normal sinus rhythm 65 bpm, right atrial enlargement, otherwise within normal limits.  Recent Labs: No results found for requested labs within last 365 days.  Recent Lipid Panel    Component Value Date/Time   CHOL 154 10/13/2011 0500   TRIG 105 10/13/2011 0500   HDL 47 10/13/2011 0500   CHOLHDL 3.3 10/13/2011 0500   VLDL 21 10/13/2011 0500   LDLCALC 86 10/13/2011 0500     Risk Assessment/Calculations:                Physical Exam:    VS:  BP 130/80   Pulse 65   Ht 5' (1.524 m)   Wt 96 lb (43.5 kg)   SpO2 99%   BMI 18.75 kg/m     Wt Readings from Last 3 Encounters:  08/28/22 96 lb (43.5 kg)  12/31/21 105 lb (47.6 kg)  05/19/21 105 lb (47.6 kg)     GEN:  Well nourished, well developed in no acute distress HEENT: Normal NECK: No JVD; No carotid bruits LYMPHATICS: No lymphadenopathy CARDIAC: RRR, no murmurs, rubs, gallops RESPIRATORY:  Clear to auscultation without rales, wheezing or rhonchi  ABDOMEN: Soft, non-tender, non-distended MUSCULOSKELETAL:  No edema; No deformity  SKIN: Warm and dry NEUROLOGIC:  Alert and oriented x 3 PSYCHIATRIC:  Normal affect   ASSESSMENT:    1. Essential hypertension   2. Mixed hyperlipidemia   3. Palpitations    PLAN:    In order of problems listed above:  Blood pressure well-controlled on hydrochlorothiazide, metoprolol succinate, and Cardizem CD.  Most recent labs reviewed.  K 4.3, creatinine 0.82. Continue current management. Treated with atorvastatin 40 mg daily.  LDL cholesterol 72 mg/dL.  Continue current management. Overall stable on a beta-blocker and calcium  channel blocker.  No history of structural heart disease.  Continue current management.           Medication Adjustments/Labs and Tests Ordered: Current medicines are reviewed at length with the patient today.  Concerns regarding medicines are outlined above.  Orders Placed This Encounter  Procedures   EKG 12-Lead   No orders of the defined types were placed in this encounter.   Patient Instructions  Medication Instructions:  Your physician recommends  that you continue on your current medications as directed. Please refer to the Current Medication list given to you today.  *If you need a refill on your cardiac medications before your next appointment, please call your pharmacy*   Lab Work: NONE If you have labs (blood work) drawn today and your tests are completely normal, you will receive your results only by: [place] (if you have MyChart) OR A paper copy in the mail If you have any lab test that is abnormal or we need to change your treatment, we will call you to review the results.   Testing/Procedures: NONE   Follow-Up: At [facility], you and your health needs are our priority.  As part of our continuing mission to provide you with exceptional heart care, we have created designated Provider Care Teams.  These Care Teams include your primary Cardiologist (physician) and Advanced Practice Providers (APPs -  Physician Assistants and Nurse Practitioners) who all work together to provide you with the care you need, when you need it.  We recommend signing up for the patient portal called "MyChart".  Sign up information is provided on this After Visit Summary.  MyChart is used to connect with patients for Virtual Visits (Telemedicine).  Patients are able to view lab/test results, encounter notes, upcoming appointments, etc.  Non-urgent messages can be sent to your provider as well.   To learn more about what you can do with MyChart, go to  [email or web address].    Your next appointment:   1 year(s)  Provider:   Sherren Mocha, MD        Signed, Sherren Mocha, MD  08/28/2022 5:53 PM    [place]

## 2022-08-28 NOTE — Patient Instructions (Signed)
Medication Instructions:  Your physician recommends that you continue on your current medications as directed. Please refer to the Current Medication list given to you today.  *If you need a refill on your cardiac medications before your next appointment, please call your pharmacy*   Lab Work: NONE If you have labs (blood work) drawn today and your tests are completely normal, you will receive your results only by: [place] (if you have MyChart) OR A paper copy in the mail If you have any lab test that is abnormal or we need to change your treatment, we will call you to review the results.   Testing/Procedures: NONE   Follow-Up: At [facility] you and your health needs are our priority.  As part of our continuing mission to provide you with exceptional heart care, we have created designated Provider Care Teams.  These Care Teams include your primary Cardiologist (physician) and Advanced Practice Providers (APPs -  Physician Assistants and Nurse Practitioners) who all work together to provide you with the care you need, when you need it.  We recommend signing up for the patient portal called "MyChart".  Sign up information is provided on this After Visit Summary.  MyChart is used to connect with patients for Virtual Visits (Telemedicine).  Patients are able to view lab/test results, encounter notes, upcoming appointments, etc.  Non-urgent messages can be sent to your provider as well.   To learn more about what you can do with MyChart, go to [email or web address].    Your next appointment:   1 year(s)  Provider:   Sherren Mocha, MD

## 2023-08-30 ENCOUNTER — Ambulatory Visit: Payer: Medicare Other | Attending: Cardiovascular Disease | Admitting: Cardiovascular Disease

## 2023-08-30 ENCOUNTER — Encounter: Payer: Self-pay | Admitting: Cardiovascular Disease

## 2023-08-30 VITALS — BP 142/70 | HR 64 | Ht 59.0 in | Wt 99.6 lb

## 2023-08-30 DIAGNOSIS — E782 Mixed hyperlipidemia: Secondary | ICD-10-CM | POA: Diagnosis not present

## 2023-08-30 DIAGNOSIS — I10 Essential (primary) hypertension: Secondary | ICD-10-CM | POA: Insufficient documentation

## 2023-08-30 DIAGNOSIS — R002 Palpitations: Secondary | ICD-10-CM | POA: Insufficient documentation

## 2023-08-30 NOTE — Assessment & Plan Note (Signed)
 Stable, continue metoprolol succinate

## 2023-08-30 NOTE — Assessment & Plan Note (Addendum)
 Treated with atorvastatin.  History of remote stroke.  Lipids are well-controlled on current therapy.  I reviewed her recent labs and LDL cholesterol is 61. Continue current Rx.

## 2023-08-30 NOTE — Assessment & Plan Note (Signed)
 Blood pressure well-controlled on diltiazem and metoprolol succinate.  Hydrochlorothiazide has been discontinued.  Patient seems to be doing well.

## 2023-08-30 NOTE — Progress Notes (Signed)
 Cardiology Office Note:    Date:  08/30/2023   ID:  Kelli Lucas, DOB Dec 06, 1929, MRN [id number]  PCP:  Mattie Marlin, DO   [facility] Providers Cardiologist:  Tonny Bollman, MD     Referring MD: Barbie Banner, MD   Chief Complaint  Patient presents with   Hypertension    History of Present Illness:    Kelli Lucas is a 89 y.o. female with a hx of:  Heart palpitations Hypertension Hyperlipidemia S/p CVA in 2013 S/p L THR CLL GERD   The patient is here alone today.  She has been doing fine from a cardiac standpoint.  She has occasional heart palpitations but these have been longstanding and they are unchanged.  No chest pain, chest pressure, or shortness of breath.  She lives nearby her daughter who is her primary support.  The patient still lives independently.  Her husband died about 1 year ago and she lost her son before that.  Current Medications: Current Meds  Medication Sig   aspirin 325 MG tablet Take 325 mg by mouth daily.   atorvastatin (LIPITOR) 40 MG tablet Take 40 mg by mouth daily at 6 PM.    Cyanocobalamin (VITAMIN B-12) 5000 MCG TBDP Take 1,000 Units by mouth daily.   diltiazem (CARDIZEM CD) 180 MG 24 hr capsule Take 180 mg by mouth daily.   metoprolol succinate (TOPROL-XL) 50 MG 24 hr tablet Take 50 mg by mouth every evening. Take with or immediately following a meal.    [DISCONTINUED] acetaminophen (TYLENOL) 325 MG tablet Take 2 tablets (650 mg total) by mouth every 6 (six) hours as needed.   [DISCONTINUED] FLUoxetine (PROZAC) 40 MG capsule Take 1 capsule by mouth daily.   [DISCONTINUED] hydrochlorothiazide (HYDRODIURIL) 12.5 MG tablet Take 12.5 mg by mouth daily.     Allergies:   Phenobarbital, Penicillins, and Sulfa antibiotics   ROS:   Please see the history of present illness.    All other systems reviewed and are negative.  EKGs/Labs/Other Studies Reviewed:    The following studies were reviewed today:     EKG:         Recent Labs: No results found for requested labs within last 365 days.  Recent Lipid Panel    Component Value Date/Time   CHOL 154 10/13/2011 0500   TRIG 105 10/13/2011 0500   HDL 47 10/13/2011 0500   CHOLHDL 3.3 10/13/2011 0500   VLDL 21 10/13/2011 0500   LDLCALC 86 10/13/2011 0500        Physical Exam:    VS:  BP (!) 142/70   Pulse 64   Ht 4\' 11"  (1.499 m)   Wt 99 lb 9.6 oz (45.2 kg)   SpO2 98%   BMI 20.12 kg/m     Wt Readings from Last 3 Encounters:  08/30/23 99 lb 9.6 oz (45.2 kg)  08/28/22 96 lb (43.5 kg)  12/31/21 105 lb (47.6 kg)     GEN:  Well nourished, well developed delightful elderly woman in no acute distress HEENT: Normal NECK: No JVD; No carotid bruits LYMPHATICS: No lymphadenopathy CARDIAC: RRR, no murmurs, rubs, gallops RESPIRATORY:  Clear to auscultation without rales, wheezing or rhonchi  ABDOMEN: Soft, non-tender, non-distended MUSCULOSKELETAL:  No edema; No deformity  SKIN: Warm and dry NEUROLOGIC:  Alert and oriented x 3 PSYCHIATRIC:  Normal affect   Assessment & Plan Essential hypertension Blood pressure well-controlled on diltiazem and metoprolol succinate.  Hydrochlorothiazide has been discontinued.  Patient seems to be  doing well. Palpitations Stable, continue metoprolol succinate Mixed hyperlipidemia Treated with atorvastatin.  History of remote stroke.  Lipids are well-controlled on current therapy.  I reviewed her recent labs and LDL cholesterol is 61. Continue current Rx.             Medication Adjustments/Labs and Tests Ordered: Current medicines are reviewed at length with the patient today.  Concerns regarding medicines are outlined above.  Orders Placed This Encounter  Procedures   EKG 12-Lead   No orders of the defined types were placed in this encounter.   Patient Instructions  Follow-Up: At [facility], you and your health needs are our priority.  As part of our continuing mission to provide you with  exceptional heart care, we have created designated Provider Care Teams.  These Care Teams include your primary Cardiologist (physician) and Advanced Practice Providers (APPs -  Physician Assistants and Nurse Practitioners) who all work together to provide you with the care you need, when you need it.  We recommend signing up for the patient portal called "MyChart".  Sign up information is provided on this After Visit Summary.  MyChart is used to connect with patients for Virtual Visits (Telemedicine).  Patients are able to view lab/test results, encounter notes, upcoming appointments, etc.  Non-urgent messages can be sent to your provider as well.   To learn more about what you can do with MyChart, go to [email or web address].    Your next appointment:   1 year(s)  Provider:   Tonny Bollman, MD      1st Floor: - Lobby - Registration  - Pharmacy  - Lab - Cafe  2nd Floor: - PV Lab - Diagnostic Testing (echo, CT, nuclear med)  3rd Floor: - Vacant  4th Floor: - TCTS (cardiothoracic surgery) - AFib Clinic - Structural Heart Clinic - Vascular Surgery  - Vascular Ultrasound  5th Floor: - HeartCare Cardiology (general and EP) - Clinical Pharmacy for coumadin, hypertension, lipid, weight-loss medications, and med management appointments    Valet parking services will be available as well.     Signed, Tonny Bollman, MD  08/30/2023 4:57 PM    [facility]

## 2023-08-30 NOTE — Patient Instructions (Signed)
 Follow-Up: At [facility], you and your health needs are our priority.  As part of our continuing mission to provide you with exceptional heart care, we have created designated Provider Care Teams.  These Care Teams include your primary Cardiologist (physician) and Advanced Practice Providers (APPs -  Physician Assistants and Nurse Practitioners) who all work together to provide you with the care you need, when you need it.  We recommend signing up for the patient portal called "MyChart".  Sign up information is provided on this After Visit Summary.  MyChart is used to connect with patients for Virtual Visits (Telemedicine).  Patients are able to view lab/test results, encounter notes, upcoming appointments, etc.  Non-urgent messages can be sent to your provider as well.   To learn more about what you can do with MyChart, go to [email or web address].    Your next appointment:   1 year(s)  Provider:   Tonny Bollman, MD        1st Floor: - Lobby - Registration  - Pharmacy  - Lab - Cafe  2nd Floor: - PV Lab - Diagnostic Testing (echo, CT, nuclear med)  3rd Floor: - Vacant  4th Floor: - TCTS (cardiothoracic surgery) - AFib Clinic - Structural Heart Clinic - Vascular Surgery  - Vascular Ultrasound  5th Floor: - HeartCare Cardiology (general and EP) - Clinical Pharmacy for coumadin, hypertension, lipid, weight-loss medications, and med management appointments    Valet parking services will be available as well.

## 2023-12-16 ENCOUNTER — Ambulatory Visit (INDEPENDENT_AMBULATORY_CARE_PROVIDER_SITE_OTHER): Admitting: Otolaryngology

## 2023-12-16 ENCOUNTER — Ambulatory Visit (INDEPENDENT_AMBULATORY_CARE_PROVIDER_SITE_OTHER): Admitting: Audiology

## 2023-12-16 VITALS — BP 139/72 | HR 72 | Ht 60.0 in | Wt 100.0 lb

## 2023-12-16 DIAGNOSIS — H6121 Impacted cerumen, right ear: Secondary | ICD-10-CM | POA: Diagnosis not present

## 2023-12-16 DIAGNOSIS — D2321 Other benign neoplasm of skin of right ear and external auricular canal: Secondary | ICD-10-CM | POA: Diagnosis not present

## 2023-12-18 DIAGNOSIS — H6121 Impacted cerumen, right ear: Secondary | ICD-10-CM | POA: Insufficient documentation

## 2023-12-18 DIAGNOSIS — D2321 Other benign neoplasm of skin of right ear and external auricular canal: Secondary | ICD-10-CM | POA: Insufficient documentation

## 2023-12-18 NOTE — Progress Notes (Signed)
 CC: Right ear lesion  HPI:  Kelli Lucas is a 89 y.o. female who presents today for evaluation of her right ear lesion.  According to the patient, she first noted a skin lesion on her right auricle in July 2024.  It was mildly tender to touch.  She was seen by a dermatologist.  The biopsy was negative for malignancy.  She was treated with cryotherapy.  However, the lesion persists.  Currently the patient is asymptomatic.  The area is not tender to touch.  She denies any hearing difficulty.  She denies any skin erythema, otalgia, or otorrhea.  Past Medical History:  Diagnosis Date   Anginal pain (HCC)    Arthritis    knees, hip   Chronic lymphoid leukemia (HCC)    Complication of anesthesia    Dysrhythmia    from teenager on   GERD (gastroesophageal reflux disease)    Headache    Hx   Heart palpitations     lifelong symptoms   History of hiatal hernia    HLD (hyperlipidemia)    takes lipitor-last lipid panel about 3 mo ago, and was good   Hypertension    Leukemia, chronic lymphoid (HCC)    chronic lymphocytis Leukemia-MD Allyn Ivy, Jason-has had this for about about 1 yr.   Is no t on any current therapy-is being watcvhed   Panic attacks    Hx   PONV (postoperative nausea and vomiting)    Stroke [facility]) 2013    Past Surgical History:  Procedure Laterality Date   BREAST LUMPECTOMY Left 1980s   lumpectomy   EYE SURGERY Bilateral 2019, 2020   HEMORRHOID SURGERY  1994   TEE WITHOUT CARDIOVERSION  10/15/2011   Procedure: TRANSESOPHAGEAL ECHOCARDIOGRAM (TEE);  Surgeon: Elmyra Haggard, MD;  Location: [facility] ENDOSCOPY;  Service: Cardiovascular;  Laterality: N/A;   TONSILLECTOMY     TOTAL HIP ARTHROPLASTY Left 04/13/2020   Procedure: TOTAL HIP ARTHROPLASTY ANTERIOR APPROACH;  Surgeon: Liliane Rei, MD;  Location: [facility] ORS;  Service: Orthopedics;  Laterality: Left;     Family History  Problem Relation Age of Onset   Heart failure Mother        liverd till 62   Stroke Father         LIVED TO 55 YR OF AGE   Coronary artery disease Father        HEART ATTACK WHERN IN 25'S    Social History:  reports that she has never smoked. She has never used smokeless tobacco. She reports that she does not drink alcohol and does not use drugs.  Allergies:  Allergies  Allergen Reactions   Phenobarbital Nausea And Vomiting   Penicillins Rash    Tolerated Cephalosporin 04/13/20     Sulfa Antibiotics Rash    Prior to Admission medications   Medication Sig Start Date End Date Taking? Authorizing Provider  aspirin  325 MG tablet Take 325 mg by mouth daily.   Yes [provider]  atorvastatin  (LIPITOR) 40 MG tablet Take 40 mg by mouth daily at 6 PM.    Yes [provider]  Cyanocobalamin (VITAMIN B-12) 5000 MCG TBDP Take 1,000 Units by mouth daily.   Yes [provider]  diltiazem  (CARDIZEM  CD) 180 MG 24 hr capsule Take 180 mg by mouth daily.   Yes [provider]  metoprolol  succinate (TOPROL -XL) 50 MG 24 hr tablet Take 50 mg by mouth every evening. Take with or immediately following a meal.    Yes [provider]  Blood pressure 139/72, pulse 72, height 5' (1.524 m), weight 100 lb (45.4 kg), SpO2 98%. Exam: General: Communicates without difficulty, well nourished, no acute distress. Head: Normocephalic, no evidence injury, no tenderness, facial buttresses intact without stepoff. Face/sinus: No tenderness to palpation and percussion. Facial movement is normal and symmetric. Eyes: PERRL, EOMI. No scleral icterus, conjunctivae clear. Neuro: CN II exam reveals vision grossly intact.  No nystagmus at any point of gaze. Ears: Auricles well formed with a 1 cm scaly lesion on the right concha bowl.  Right ear cerumen impaction.  The left ear canal and tympanic membrane are normal.  Nose: External evaluation reveals normal support and skin without lesions.  Dorsum is intact.  Anterior rhinoscopy reveals congested mucosa over anterior aspect of  inferior turbinates and intact septum.  No purulence noted. Oral:  Oral cavity and oropharynx are intact, symmetric, without erythema or edema.  Mucosa is moist without lesions. Neck: Full range of motion without pain.  There is no significant lymphadenopathy.  No masses palpable.  Thyroid bed within normal limits to palpation.  Parotid glands and submandibular glands equal bilaterally without mass.  Trachea is midline. Neuro:  CN 2-12 grossly intact.   Procedure: Right ear cerumen disimpaction Anesthesia: None Description: Under the operating microscope, the cerumen is carefully removed with a combination of cerumen currette, alligator forceps, and suction catheters.  After the cerumen is removed, the TMs are noted to be normal.  No mass, erythema, or lesions. The patient tolerated the procedure well.    Assessment: 1.  The patient has a scaly lesion within the concha bowl of her right auricle. 2.  Her previous biopsy was negative for malignancy. 3.  The patient is currently asymptomatic. 4.  Incidental finding of right ear cerumen impaction.  After the disimpaction procedure, the tympanic membrane and middle ear space are noted to be normal.  Plan: 1.  The physical exam findings are reviewed with the patient. 2.  The treatment options are discussed with the patient.  The options include conservative observation versus surgical excision.  The risk, benefits, and details of the procedure discussed. 3.  Since the patient is currently asymptomatic, she would like to proceed with conservative observation for now. 4.  Otomicroscopy with right ear cerumen disimpaction. 5.  The patient is encouraged to call with any questions or concerns.  Zayana Salvador W Jermel Artley 12/18/2023, 10:16 AM
